# Patient Record
Sex: Female | Born: 1980 | Race: White | Hispanic: No | Marital: Married | Smoking: Never smoker
Health system: Southern US, Community
[De-identification: ages and names within clinical notes are randomized; demographics above are authoritative.]

## PROBLEM LIST (undated history)

## (undated) DIAGNOSIS — F329 Major depressive disorder, single episode, unspecified: Secondary | ICD-10-CM

## (undated) DIAGNOSIS — F32A Depression, unspecified: Secondary | ICD-10-CM

## (undated) HISTORY — DX: Major depressive disorder, single episode, unspecified: F32.9

## (undated) HISTORY — DX: Depression, unspecified: F32.A

---

## 2002-12-26 HISTORY — PX: CHOLECYSTECTOMY: SHX55

## 2002-12-26 HISTORY — PX: GASTRIC BYPASS: SHX52

## 2010-12-26 HISTORY — PX: DILATION AND CURETTAGE OF UTERUS: SHX78

## 2011-05-13 ENCOUNTER — Ambulatory Visit (INDEPENDENT_AMBULATORY_CARE_PROVIDER_SITE_OTHER): Payer: Managed Care, Other (non HMO) | Admitting: Family Medicine

## 2011-05-13 ENCOUNTER — Encounter: Payer: Self-pay | Admitting: Family Medicine

## 2011-05-13 VITALS — BP 118/77 | HR 66 | Temp 97.9°F | Ht 67.0 in | Wt 210.0 lb

## 2011-05-13 DIAGNOSIS — M7750 Other enthesopathy of unspecified foot: Secondary | ICD-10-CM

## 2011-05-13 DIAGNOSIS — M7740 Metatarsalgia, unspecified foot: Secondary | ICD-10-CM

## 2011-05-13 NOTE — Patient Instructions (Signed)
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 Metatarsal pads as well as a cushioned insole are the main two parts of treatment for these - wear as much as possible. For shoes that are too small for the insoles, put metatarsal pads in these in the same place I showed you. Ice foot 15 minutes at a time 3-4 times a day (especially at the end of the day). Tylenol and/or aleve as needed for pain. These inserts tend to last about 4 months, custom orthotics last about 2-3 years. You can order the inserts from [facility] or we can make you orthotics here if you need these long term.

## 2011-05-16 ENCOUNTER — Encounter: Payer: Self-pay | Admitting: Family Medicine

## 2011-05-16 DIAGNOSIS — M7740 Metatarsalgia, unspecified foot: Secondary | ICD-10-CM | POA: Insufficient documentation

## 2011-05-16 NOTE — Assessment & Plan Note (Signed)
pain and exam consistent with metatarsalgia.  No palpable mortons neuroma and no tenderness dorsum of foot between metatarsals.  She brought comforthotic but has not been using - added metatarsal pads (medium) to these which felt comfortable.  Has breakdown bilaterally but only hurting on right side.  Doubt she is developing a 5th MT stress fracture - she is not running and pain is recent - likely related to gait change from metatarsalgia.  Did advise her however that if pain worsens instead of improves with rest, cushion over next 2-3 weeks, would proceed with x-rays, ultrasound to further evaluate for this.

## 2011-05-16 NOTE — Progress Notes (Signed)
  Subjective:    Patient ID: Morgan Burgess, female    DOB: 1981-07-05, 30 y.o.   MRN: [id number]  HPI  30 yo F here for right plantar foot pain.  Patient reports no known injury. Usually goes for a 4 mile walk each night. On Wednesday walked 8 miles instead and 6 miles on Thursday. Began having plantar right foot pain (and lateral pain) Thursday along with limping. Has continued through today. Tried moleskin over area of pain, ibuprofen. No prior problems with feet. No swelling or bruising.  History reviewed. No pertinent past medical history.  No current outpatient prescriptions on file prior to visit.    History reviewed. No pertinent past surgical history.  Allergies  Allergen Reactions  . Latex     History   Social History  . Marital Status: Married    Spouse Name: N/A    Number of Children: N/A  . Years of Education: N/A   Occupational History  . Not on file.   Social History Main Topics  . Smoking status: Never Smoker   . Smokeless tobacco: Not on file  . Alcohol Use: Not on file  . Drug Use: Not on file  . Sexually Active: Not on file   Other Topics Concern  . Not on file   Social History Narrative  . No narrative on file    Family History  Problem Relation Age of Onset  . Hypertension Mother   . Diabetes Neg Hx   . Heart attack Neg Hx     BP 118/77  Pulse 66  Temp(Src) 97.9 F (36.6 C) (Oral)  Ht 5\' 7"  (1.702 m)  Wt 210 lb (95.255 kg)  BMI 32.89 kg/m2  Review of Systems See HPI above.    Objective:   Physical Exam Gen: NAD R foot: Wide forefoot.  Collapsed transverse arch.  MT heads 2nd-4th prominent with callus > left foot but present on left foot as well. Mild collapse long arch. TTP 2nd MT head focally.  Mild TTP through 5th MT.  No other TTP about foot. FROM toes and ankle. 5/5 strength all motions ankle. NVI distally with < 2 sec cap refill, 2+ dp pulses.     Assessment & Plan:  1. Metatarsalgia - pain and exam consistent  with metatarsalgia.  No palpable mortons neuroma and no tenderness dorsum of foot between metatarsals.  She brought comforthotic but has not been using - added metatarsal pads (medium) to these which felt comfortable.  Has breakdown bilaterally but only hurting on right side.  Doubt she is developing a 5th MT stress fracture - she is not running and pain is recent - likely related to gait change from metatarsalgia.  Did advise her however that if pain worsens instead of improves with rest, cushion over next 2-3 weeks, would proceed with x-rays, ultrasound to further evaluate for this.

## 2013-11-01 ENCOUNTER — Ambulatory Visit (INDEPENDENT_AMBULATORY_CARE_PROVIDER_SITE_OTHER): Payer: Managed Care, Other (non HMO) | Admitting: Family Medicine

## 2013-11-01 ENCOUNTER — Encounter: Payer: Self-pay | Admitting: Family Medicine

## 2013-11-01 VITALS — BP 116/78 | HR 93 | Temp 98.1°F | Resp 16 | Ht 66.5 in | Wt 252.4 lb

## 2013-11-01 DIAGNOSIS — F411 Generalized anxiety disorder: Secondary | ICD-10-CM | POA: Insufficient documentation

## 2013-11-01 DIAGNOSIS — Z9884 Bariatric surgery status: Secondary | ICD-10-CM | POA: Insufficient documentation

## 2013-11-01 MED ORDER — ZOLPIDEM TARTRATE 10 MG PO TABS: 10.0000 mg | ORAL_TABLET | Freq: Once | ORAL | Status: DC

## 2013-11-01 MED ORDER — BUSPIRONE HCL 15 MG PO TABS: 15.0000 mg | ORAL_TABLET | Freq: Two times a day (BID) | ORAL | Status: DC

## 2013-11-01 MED ORDER — DICYCLOMINE HCL 20 MG PO TABS: 20.0000 mg | ORAL_TABLET | Freq: Three times a day (TID) | ORAL | Status: DC

## 2013-11-01 NOTE — Progress Notes (Signed)
  Subjective:    Patient ID: Morgan Burgess, female    DOB: 25-Oct-1981, 32 y.o.   MRN: [id number]  HPI New to establish.  Previous MD- [facility]  Depression- chronic problem.  Was previously on meds (Cymbalta) but stopped 'cold [place]' b/c she was trying to get pregnant.  Previous MD thought pt should try adderall.  Was also on Ativan and Ambien.  'i just worry myself to death'.  Just resumed counseling- therapist recommended being seen q2 weeks.  Is considering pregnancy.  Hx of gastric bypass- due for labs.   Review of Systems For ROS see HPI     Objective:   Physical Exam  Vitals reviewed. Constitutional: She is oriented to person, place, and time. She appears well-developed and well-nourished. No distress.  HENT:  Head: Normocephalic and atraumatic.  Eyes: Conjunctivae and EOM are normal. Pupils are equal, round, and reactive to light.  Neck: Normal range of motion. Neck supple. No thyromegaly present.  Cardiovascular: Normal rate, regular rhythm, normal heart sounds and intact distal pulses.   No murmur heard. Pulmonary/Chest: Effort normal and breath sounds normal. No respiratory distress.  Abdominal: Soft. She exhibits no distension. There is no tenderness.  Musculoskeletal: She exhibits no edema.  Lymphadenopathy:    She has no cervical adenopathy.  Neurological: She is alert and oriented to person, place, and time.  Skin: Skin is warm and dry.  Psychiatric: She has a normal mood and affect. Her behavior is normal.          Assessment & Plan:

## 2013-11-01 NOTE — Patient Instructions (Signed)
Follow up in 6-8 weeks to recheck mood Start the Buspar- 1/2 tab twice daily for 1 week and then increase to full tab twice daily Continue counseling- this is VERY important Try and find a stress outlet We'll notify you of your lab results and make any changes if needed Call with any questions or concerns Hang in there!!

## 2013-11-04 ENCOUNTER — Encounter: Payer: Self-pay | Admitting: Family Medicine

## 2013-11-04 LAB — VITAMIN D 1,25 DIHYDROXY
Vitamin D2 1, 25 (OH)2: 21 pg/mL
Vitamin D3 1, 25 (OH)2: 46 pg/mL

## 2013-11-05 ENCOUNTER — Encounter: Payer: Self-pay | Admitting: Family Medicine

## 2013-11-06 ENCOUNTER — Encounter: Payer: Self-pay | Admitting: General Practice

## 2013-11-13 ENCOUNTER — Encounter: Payer: Self-pay | Admitting: Family Medicine

## 2013-11-24 NOTE — Assessment & Plan Note (Signed)
New to provider, has list of required labs.  Will draw today.

## 2013-11-24 NOTE — Assessment & Plan Note (Signed)
New.  Moderate to severe.  Pt is in counseling.  Considering pregnancy so will attempt to control sxs w/ Buspar which is Category B in pregnancy.  Will follow closely.  Total time spent w/ pt, 30 min, >50% spent counseling

## 2013-11-26 ENCOUNTER — Encounter: Payer: Self-pay | Admitting: General Practice

## 2013-11-29 ENCOUNTER — Ambulatory Visit (INDEPENDENT_AMBULATORY_CARE_PROVIDER_SITE_OTHER): Payer: BC Managed Care – PPO | Admitting: Family Medicine

## 2013-11-29 ENCOUNTER — Encounter: Payer: Self-pay | Admitting: Family Medicine

## 2013-11-29 ENCOUNTER — Ambulatory Visit: Payer: Managed Care, Other (non HMO) | Admitting: Family Medicine

## 2013-11-29 VITALS — BP 120/80 | HR 80 | Temp 98.1°F | Resp 16 | Wt 254.1 lb

## 2013-11-29 DIAGNOSIS — Z3490 Encounter for supervision of normal pregnancy, unspecified, unspecified trimester: Secondary | ICD-10-CM | POA: Insufficient documentation

## 2013-11-29 DIAGNOSIS — F411 Generalized anxiety disorder: Secondary | ICD-10-CM

## 2013-11-29 DIAGNOSIS — Z331 Pregnant state, incidental: Secondary | ICD-10-CM

## 2013-11-29 MED ORDER — OB COMPLETE PETITE 35-5-1-200 MG PO CAPS: 1.0000 | ORAL_CAPSULE | Freq: Every day | ORAL | Status: DC

## 2013-11-29 NOTE — Progress Notes (Signed)
   Subjective:    Patient ID: Morgan Burgess, female    DOB: Dec 23, 1981, 32 y.o.   MRN: [id number]  HPI Pre visit review using our clinic review tool, if applicable. No additional management support is needed unless otherwise documented below in the visit note.  Anxiety- feels that meds have helped.  Taking med once daily rather than twice.  Feels that anxiety has improved.  Counseling is helping- going q2 weeks.  Pt is currently pregnant.  Has OB visit upcoming- [facility].  Needs script for PNV- OB complete petite.    Review of Systems For ROS see HPI     Objective:   Physical Exam  Vitals reviewed. Constitutional: She is oriented to person, place, and time. She appears well-developed and well-nourished. No distress.  Neurological: She is alert and oriented to person, place, and time.  Skin: Skin is warm and dry.  Psychiatric: She has a normal mood and affect. Her behavior is normal. Thought content normal.          Assessment & Plan:

## 2013-11-29 NOTE — Patient Instructions (Signed)
Follow up as needed Set your pill reminder to take both the morning Buspar and the Vitamin CONGRATS!!! Happy Holidays!!!

## 2013-12-01 NOTE — Assessment & Plan Note (Signed)
Improved on Buspar.  Encouraged pt to take meds twice daily for even better sx control.  Pt has stopped benzos and Cymbalta due to pregnancy.  Continues counseling.  Will follow.

## 2013-12-01 NOTE — Assessment & Plan Note (Signed)
New.  Pt had upcoming OB appt but needs special prescription PNV due to gastric bypass and size of pills.  Script sent.

## 2013-12-04 MED ORDER — FOLIC ACID 800 MCG PO TABS: 800.0000 ug | ORAL_TABLET | Freq: Every day | ORAL | Status: DC

## 2014-03-19 ENCOUNTER — Encounter: Payer: Self-pay | Admitting: Family Medicine

## 2014-03-20 ENCOUNTER — Other Ambulatory Visit: Payer: Self-pay | Admitting: Family Medicine

## 2014-03-20 MED ORDER — BUSPIRONE HCL 15 MG PO TABS: 15.0000 mg | ORAL_TABLET | Freq: Two times a day (BID) | ORAL | Status: DC

## 2014-04-17 ENCOUNTER — Other Ambulatory Visit: Payer: Self-pay | Admitting: Family Medicine

## 2014-04-17 MED ORDER — DICYCLOMINE HCL 20 MG PO TABS: 20.0000 mg | ORAL_TABLET | Freq: Three times a day (TID) | ORAL | Status: DC

## 2014-04-17 NOTE — Telephone Encounter (Signed)
Please advise if refill is appropriate.     KP 

## 2014-06-02 ENCOUNTER — Telehealth: Payer: Self-pay | Admitting: Family Medicine

## 2014-06-02 MED ORDER — LEVOCETIRIZINE DIHYDROCHLORIDE 5 MG PO TABS: 5.0000 mg | ORAL_TABLET | Freq: Every evening | ORAL | Status: DC

## 2014-06-02 NOTE — Telephone Encounter (Signed)
[phone] °

## 2014-06-02 NOTE — Telephone Encounter (Signed)
Caller name:Jacalynn Shella Spearing Relation to pt: patient Call back number:[phone] Pharmacy:[facility] [place] - [address], patient would like a call once this is completed. Please advise.    Reason for call: patient called to request a refill for levocetirizine (XYZAL) 5 MG tablet

## 2014-06-02 NOTE — Telephone Encounter (Signed)
Med filled.  

## 2014-06-02 NOTE — Telephone Encounter (Signed)
Patient states she was told to follow up in 1 year from 11/2013 since she is pregnant. She needs more than 1 refill on her allergy medicine.

## 2014-07-01 ENCOUNTER — Telehealth: Payer: Self-pay | Admitting: General Practice

## 2014-07-01 NOTE — Telephone Encounter (Signed)
Ok for #60, 1 refill.  Needs CSC and UDS if not already on file

## 2014-07-01 NOTE — Telephone Encounter (Signed)
Last OV 11-29-13 This is listed as a reported med, no record of us ever filling. SIG states lorazepam 0.5mg , take 1 tablet by mouth twice daily as needed, Dispense #60.   Please advise, no CSC or UDS on file

## 2014-07-02 MED ORDER — LORAZEPAM 0.5 MG PO TABS: 0.5000 mg | ORAL_TABLET | Freq: Three times a day (TID) | ORAL | Status: DC | PRN

## 2014-07-02 NOTE — Telephone Encounter (Signed)
Med filled, CSC printed, pt notified to pick up rx at the office.

## 2014-07-18 NOTE — Telephone Encounter (Signed)
Pt provided UDS to lab. Pt wanted us to be aware that shes taking percocet that she received from the dentist office.

## 2014-08-01 ENCOUNTER — Encounter: Payer: Self-pay | Admitting: Family Medicine

## 2014-08-08 ENCOUNTER — Telehealth: Payer: Self-pay

## 2014-08-08 NOTE — Telephone Encounter (Signed)
Bentyl is Category B in pregnancy (completely safe) but not recommended while breastfeeding.  She has not done any harm to the baby but should stop the medication

## 2014-08-08 NOTE — Telephone Encounter (Signed)
Caller name:[name] Relation to pt: Call back number:[phone] Pharmacy:  Reason for call: Morgan Burgess called she has just come home from hospital and the pharmacy told her she should not be taking Bentyl while breast feeding. She has taken it the whole time she was pregnant and while in the hospital.Please call an advise her of what to do

## 2014-08-08 NOTE — Telephone Encounter (Signed)
Called and left a detailed message regarding bentyl and lorazepam.  Called husband to see if we could get in contact with patient before phones are shut off.  Husband answered and handed phone to Dr. Beverely Lowabori.

## 2014-08-10 NOTE — Telephone Encounter (Signed)
Spoke w/ pt on Friday 8/14 at 5pm and instructed pt NOT to take Bentyl while breastfeeding.  Pt reports her abdominal issues stem from her lactose intolerance- pt able to take Lactaid w/o impact on breast feeding.  Discussed w/ pt her request request for Lorazepam.  Pt reports this was a pharmacy error and she remembered that I told her not to take medication.  She has not taken Lorazepam during pregnancy nor since delivery.  Applauded this decision.

## 2014-10-01 ENCOUNTER — Other Ambulatory Visit: Payer: Self-pay | Admitting: Family Medicine

## 2014-10-01 MED ORDER — ZOLPIDEM TARTRATE 10 MG PO TABS: 10.0000 mg | ORAL_TABLET | Freq: Every day | ORAL | Status: DC

## 2014-10-01 NOTE — Telephone Encounter (Signed)
Last OV 11-29-13 ambien last filled 11-01-13 #30 with 3  No upcoming appts.

## 2014-10-01 NOTE — Telephone Encounter (Signed)
Med filled and faxed.  

## 2014-10-01 NOTE — Telephone Encounter (Signed)
Please remind pt to schedule her CPE as we are coming up on 1 yr

## 2014-10-01 NOTE — Telephone Encounter (Signed)
Last OV 11-29-13, no upcoming appts.  xyzal last filled 06-02-14 #30 with 1

## 2014-10-07 ENCOUNTER — Telehealth: Payer: Self-pay | Admitting: Family Medicine

## 2014-10-07 NOTE — Telephone Encounter (Signed)
Pt states that she went to her OBGYN for her post-partum check today and she did not pass the evaluation, and her doctor suggested that she go back on her rx cymbalta verses the busPIRone (BUSPAR) 15 MG tablet, pt needs [facility] rx [facility] [address] Pt also needs new rx for  levocetirizine (xyzal) 5 mg.

## 2014-10-08 NOTE — Telephone Encounter (Signed)
Is pt breast feeding??

## 2014-10-08 NOTE — Telephone Encounter (Signed)
Cymbalta is not recommended while breastfeeding.  Typically women who are breast feeding are started on Zoloft for post-partum depression.  If GYN wants pt on Cymbalta, would need to prescribe it for pt b/c I am not comfortable with this as long as she is breastfeeding.

## 2014-10-08 NOTE — Telephone Encounter (Signed)
Pt.notified

## 2014-10-08 NOTE — Telephone Encounter (Signed)
Pt returned call.  The answer is YES.  Call back if need more info.   [phone]

## 2014-10-08 NOTE — Telephone Encounter (Signed)
Called pt to find out had to lmovm to return call.

## 2014-11-28 ENCOUNTER — Ambulatory Visit (INDEPENDENT_AMBULATORY_CARE_PROVIDER_SITE_OTHER): Payer: BC Managed Care – PPO | Admitting: Family Medicine

## 2014-11-28 ENCOUNTER — Encounter: Payer: Self-pay | Admitting: Family Medicine

## 2014-11-28 VITALS — BP 114/80 | HR 79 | Resp 16 | Wt 239.1 lb

## 2014-11-28 DIAGNOSIS — L659 Nonscarring hair loss, unspecified: Secondary | ICD-10-CM

## 2014-11-28 DIAGNOSIS — Z9884 Bariatric surgery status: Secondary | ICD-10-CM

## 2014-11-28 NOTE — Progress Notes (Signed)
   Subjective:    Patient ID: Morgan Burgess, female    DOB: 09/21/1981, 33 y.o.   MRN: [id number]  HPI Hair thinning- 'i need blood work'.  Pt is s/p gastric bypass.  Pt thought it was due to pregnancy but bariatric surgeon recommended pt get labs done.  Pt was previously on Vit D and B12 shots but stopped due to pregnancy and breast feeding.  Denies fatigue.  Thin, brittle nails.  Denies dry skin.   Review of Systems For ROS see HPI     Objective:   Physical Exam  Constitutional: She is oriented to person, place, and time. She appears well-developed and well-nourished. No distress.  HENT:  Head: Normocephalic and atraumatic.  Neck: Normal range of motion. Neck supple. No thyromegaly present.  Cardiovascular: Normal rate, regular rhythm and normal heart sounds.   Pulmonary/Chest: Effort normal and breath sounds normal. No respiratory distress. She has no wheezes. She has no rales.  Lymphadenopathy:    She has no cervical adenopathy.  Neurological: She is alert and oriented to person, place, and time.  Skin: Skin is warm and dry.  Psychiatric: She has a normal mood and affect. Her behavior is normal. Thought content normal.  Vitals reviewed.         Assessment & Plan:

## 2014-11-28 NOTE — Patient Instructions (Signed)
Schedule your complete physical in 6 months We'll notify you of your lab results and make any changes if needed Start daily multi-vitamin and we'll add on from there Call with any questions or concerns Happy Holidays!!!

## 2014-11-28 NOTE — Progress Notes (Signed)
Pre visit review using our clinic review tool, if applicable. No additional management support is needed unless otherwise documented below in the visit note. 

## 2014-11-30 NOTE — Assessment & Plan Note (Signed)
New.  Reviewed that this may be normal post-partum hair loss.  Due to pt's hx of gastric bypass and the fact that she has stopped all vitamins/supplements will get labs to r/o abnormality/deficiency.  Pt expressed understanding and is in agreement w/ plan.

## 2014-11-30 NOTE — Assessment & Plan Note (Signed)
Chronic problem.  Pt stopped all vitamins and supplements during pregnancy.  Due for labs to determine if she has any electrolyte or vitamin abnormalities.  Replete prn.

## 2014-12-04 ENCOUNTER — Telehealth: Payer: Self-pay | Admitting: Family Medicine

## 2014-12-04 NOTE — Telephone Encounter (Signed)
Pt states she will call [facility] and request they add on iron panel.

## 2014-12-04 NOTE — Telephone Encounter (Signed)
Caller name: Efraim Kaufmannmelissa Relation to pt: self Call back number: [phone]. Please ask for patient. This is her work.  Pharmacy:  Reason for call:   Patient states that we were supposed to check her iron panel at lab visit. She would like this added on to her last lab draw.

## 2014-12-04 NOTE — Telephone Encounter (Signed)
Ok to add iron panel- pt walked out of here w/ prescription for lab orders so not sure how we will ok an add on.

## 2014-12-04 NOTE — Telephone Encounter (Signed)
I am not sure what she is talking about this pt has not had labs since 10/2013 and does not have an upcoming appt? Did you receive paperwork on her?

## 2014-12-09 ENCOUNTER — Telehealth: Payer: Self-pay | Admitting: Family Medicine

## 2014-12-09 NOTE — Telephone Encounter (Signed)
Pt inquiring about lab results

## 2014-12-09 NOTE — Telephone Encounter (Signed)
Have you seen labs?  

## 2014-12-10 NOTE — Telephone Encounter (Signed)
Pt had labs drawn at outside lab, I have not seen them yet

## 2014-12-29 NOTE — Telephone Encounter (Signed)
Patient states that she dropped off lab results last week with [facility] and is requesting a callback. Best # [phone]

## 2014-12-29 NOTE — Telephone Encounter (Signed)
Unless this was sent for scanning, I don't have a copy and don't believe I have seen it.  If we can have the lab send [name] another copy, that would be helpful

## 2014-12-29 NOTE — Telephone Encounter (Signed)
Please advise 

## 2014-12-30 NOTE — Telephone Encounter (Signed)
Pt will fax over lab results tomorrow 12/31/14

## 2014-12-31 NOTE — Telephone Encounter (Signed)
Last note stated that pt would fax them to the office today 12/31/14

## 2014-12-31 NOTE — Telephone Encounter (Signed)
Have you seen these labs? I would have put the MRN and Dr name on them and put them in your bin.

## 2014-12-31 NOTE — Telephone Encounter (Signed)
Where do I need to call to obtain these lab results?

## 2015-01-27 NOTE — Telephone Encounter (Signed)
Patient states that she came by herself and dropped off the results last week. Best # [phone]

## 2015-01-28 NOTE — Telephone Encounter (Signed)
Did you get these?

## 2015-01-28 NOTE — Telephone Encounter (Signed)
Have not seen labs

## 2015-01-28 NOTE — Telephone Encounter (Signed)
I do not have these results.  Ashlee, have you seen them?

## 2015-01-28 NOTE — Telephone Encounter (Signed)
Do you know where these labs are?

## 2015-01-29 ENCOUNTER — Telehealth: Payer: Self-pay | Admitting: Family Medicine

## 2015-01-29 NOTE — Telephone Encounter (Addendum)
Caller name: Louis MatteBoyer, Shaynah Relation to pt: self  Call back number: [phone] Pharmacy:  Reason for call:  Pt checking on the status of labs, pt states she needs her medication.

## 2015-01-29 NOTE — Telephone Encounter (Signed)
These labs cannot be found, [facility] for further inquiry.

## 2015-01-30 MED ORDER — FERROUS SULFATE 325 (65 FE) MG PO TABS: 325.0000 mg | ORAL_TABLET | Freq: Every day | ORAL | Status: DC

## 2015-01-30 MED ORDER — VITAMIN D (ERGOCALCIFEROL) 1.25 MG (50000 UNIT) PO CAPS
50000.0000 [IU] | ORAL_CAPSULE | ORAL | Status: DC
Start: 2015-01-30 — End: 2015-06-01

## 2015-01-30 NOTE — Telephone Encounter (Signed)
What meds does pt require?  I have not seen these labs in over 2 months and pt has reportedly brought or sent multiple copies.  This needs to be resolved so we can move forward.

## 2015-01-30 NOTE — Telephone Encounter (Signed)
Pt notified and meds filled. Labs sent to scanning.

## 2015-01-30 NOTE — Telephone Encounter (Signed)
The labs have not be located as of yet. It has more than likely been sent to scanning without the provider review. It has not been scanned into the chart as of yet.

## 2015-01-30 NOTE — Telephone Encounter (Signed)
Labs look good w/ exception of mild anemia, low iron, and low Vit D.  Based on this, we need to start 50,000 units weekly x12 weeks in addition to daily OTC supplement of 2000 units.  Also, needs to start FeSO4 325 daily (along w/ stool softener)

## 2015-01-30 NOTE — Telephone Encounter (Signed)
Pt brought these labs to the office again today 01/30/15. Morgan Burgess personally brought these to tabori to document on.

## 2015-01-30 NOTE — Telephone Encounter (Signed)
This pt is very unhappy at this point (understandably).  Please help us remedy this situation.

## 2015-01-30 NOTE — Telephone Encounter (Signed)
Error

## 2015-01-30 NOTE — Addendum Note (Signed)
Addended by: Jackson LatinoYLER, JESSICA L on: 01/30/2015 02:32 PM   Modules accepted: Orders

## 2015-03-23 LAB — HM PAP SMEAR: HM PAP: NORMAL

## 2015-03-31 ENCOUNTER — Other Ambulatory Visit: Payer: Self-pay | Admitting: Family Medicine

## 2015-03-31 NOTE — Telephone Encounter (Signed)
Last OV 11-28-14 ambien last filled 10-01-14 #30 with 3

## 2015-03-31 NOTE — Telephone Encounter (Signed)
Med filled and faxed.  

## 2015-04-10 ENCOUNTER — Other Ambulatory Visit: Payer: Self-pay | Admitting: Family Medicine

## 2015-04-10 ENCOUNTER — Encounter: Payer: Self-pay | Admitting: Family Medicine

## 2015-04-10 MED ORDER — LEVOCETIRIZINE DIHYDROCHLORIDE 5 MG PO TABS: 5.0000 mg | ORAL_TABLET | Freq: Every evening | ORAL | Status: DC

## 2015-04-10 MED ORDER — LORAZEPAM 0.5 MG PO TABS: 0.5000 mg | ORAL_TABLET | Freq: Three times a day (TID) | ORAL | Status: DC | PRN

## 2015-04-10 NOTE — Telephone Encounter (Signed)
Med denied, this was just filled on 03-31-15.

## 2015-04-10 NOTE — Telephone Encounter (Signed)
Med filled.  

## 2015-05-11 ENCOUNTER — Other Ambulatory Visit: Payer: Self-pay | Admitting: Physician Assistant

## 2015-05-11 ENCOUNTER — Encounter: Payer: Self-pay | Admitting: Physician Assistant

## 2015-05-11 ENCOUNTER — Other Ambulatory Visit: Payer: Self-pay | Admitting: Family Medicine

## 2015-05-11 ENCOUNTER — Ambulatory Visit (INDEPENDENT_AMBULATORY_CARE_PROVIDER_SITE_OTHER): Payer: BLUE CROSS/BLUE SHIELD | Admitting: Physician Assistant

## 2015-05-11 VITALS — BP 132/78 | HR 80 | Temp 98.2°F | Ht 67.0 in | Wt 229.2 lb

## 2015-05-11 DIAGNOSIS — F411 Generalized anxiety disorder: Secondary | ICD-10-CM

## 2015-05-11 MED ORDER — BUSPIRONE HCL 15 MG PO TABS: ORAL_TABLET | ORAL | Status: DC

## 2015-05-11 MED ORDER — KETOCONAZOLE-HYDROCORTISONE 2 & 1 % (CREAM) EX KIT: PACK | CUTANEOUS | Status: DC

## 2015-05-11 NOTE — Progress Notes (Signed)
   Patient presents to clinic today to discuss restarting medication for generalized anxiety.  Endorses increase stressors at home and work including 80-hour work weeks and possible separation from her husband. Is also taking care of her children and finding it hard to take time for herself.  Endorses increased anxiety but denies panic attack or depressed mood.  Feels that she is relying on her Ativan "way too much".  Was previously onBuspar with good relief but stopped medication when she became pregnant.  Past Medical History  Diagnosis Date  . Depression     Current Outpatient Prescriptions on File Prior to Visit  Medication Sig Dispense Refill  . ferrous sulfate 325 (65 FE) MG tablet Take 1 tablet (325 mg total) by mouth daily with breakfast. 30 tablet 3  . levocetirizine (XYZAL) 5 MG tablet Take 1 tablet (5 mg total) by mouth every evening. 30 tablet 11  . LORazepam (ATIVAN) 0.5 MG tablet Take 1 tablet (0.5 mg total) by mouth every 8 (eight) hours as needed. 60 tablet 1  . Vitamin D, Ergocalciferol, (DRISDOL) 50000 UNITS CAPS capsule Take 1 capsule (50,000 Units total) by mouth every 7 (seven) days. 12 capsule 0  . zolpidem (AMBIEN) 10 MG tablet TAKE 1 TABLET BY MOUTH EVERY NIGHT AT BEDTIME 30 tablet 0   No current facility-administered medications on file prior to visit.    Allergies  Allergen Reactions  . Latex     Family History  Problem Relation Age of Onset  . Hypertension Mother   . Diabetes Neg Hx   . Heart attack Neg Hx   . Hypertension Father     History   Social History  . Marital Status: Married    Spouse Name: N/A  . Number of Children: N/A  . Years of Education: N/A   Social History Main Topics  . Smoking status: Never Smoker   . Smokeless tobacco: Not on file  . Alcohol Use: No  . Drug Use: No  . Sexual Activity: Yes   Other Topics Concern  . None   Social History Narrative   Review of Systems - See HPI.  All other ROS are negative.  BP 132/78  mmHg  Pulse 80  Temp(Src) 98.2 F (36.8 C) (Oral)  Ht 5\' 7"  (1.702 m)  Wt 229 lb 4 oz (103.987 kg)  BMI 35.90 kg/m2  SpO2 98%  Physical Exam  Constitutional: She is oriented to person, place, and time and well-developed, well-nourished, and in no distress.  HENT:  Head: Normocephalic and atraumatic.  Cardiovascular: Normal rate, regular rhythm, normal heart sounds and intact distal pulses.   Pulmonary/Chest: Effort normal and breath sounds normal. No respiratory distress. She has no wheezes. She has no rales. She exhibits no tenderness.  Neurological: She is alert and oriented to person, place, and time.  Skin: Skin is warm and dry. No rash noted.  Vitals reviewed.    Assessment/Plan: Generalized anxiety disorder Will resume BuSpar -- 7.5 mg BID x 1 week then increase to 15 mg BID.  Continue Ativan but limit use.  Supportive measures discussed. Continue counseling sessions.  Follow-up with PCP in 1 month.

## 2015-05-11 NOTE — Telephone Encounter (Signed)
Med filled and faxed.  

## 2015-05-11 NOTE — Assessment & Plan Note (Signed)
Will resume BuSpar -- 7.5 mg BID x 1 week then increase to 15 mg BID.  Continue Ativan but limit use.  Supportive measures discussed. Continue counseling sessions.  Follow-up with PCP in 1 month.

## 2015-05-11 NOTE — Patient Instructions (Signed)
Please restart the BuSpar, taking 1/2 tablet twice daily for 1 week before increasing to 1 tablet twice daily.  Continue other mediations as directed.  Follow-up in 4 weeks with Dr. Beverely Lowabori.

## 2015-05-11 NOTE — Telephone Encounter (Signed)
Med denied.  

## 2015-05-11 NOTE — Telephone Encounter (Signed)
Last OV 11-28-14 Zolpidem last filled 03-31-15 #30 with 0

## 2015-05-12 ENCOUNTER — Other Ambulatory Visit: Payer: Self-pay | Admitting: Physician Assistant

## 2015-05-12 ENCOUNTER — Telehealth: Payer: Self-pay | Admitting: Family Medicine

## 2015-05-12 MED ORDER — KETOCONAZOLE 2 % EX SHAM: 1.0000 "application " | MEDICATED_SHAMPOO | CUTANEOUS | Status: DC

## 2015-05-12 NOTE — Telephone Encounter (Signed)
Ok to use?

## 2015-05-12 NOTE — Telephone Encounter (Signed)
Relation to pt: self  Call back number: [phone]   Reason for call:  Pt would like a 2:45pm on June 6th (same day appointment) due to work conflict. Please advise

## 2015-06-01 ENCOUNTER — Ambulatory Visit (INDEPENDENT_AMBULATORY_CARE_PROVIDER_SITE_OTHER): Payer: BLUE CROSS/BLUE SHIELD | Admitting: Family Medicine

## 2015-06-01 ENCOUNTER — Ambulatory Visit: Payer: Self-pay | Admitting: Family Medicine

## 2015-06-01 ENCOUNTER — Encounter: Payer: Self-pay | Admitting: Family Medicine

## 2015-06-01 VITALS — BP 128/80 | HR 83 | Temp 97.9°F | Resp 16 | Wt 228.0 lb

## 2015-06-01 DIAGNOSIS — F411 Generalized anxiety disorder: Secondary | ICD-10-CM | POA: Diagnosis not present

## 2015-06-01 MED ORDER — LORAZEPAM 0.5 MG PO TABS: 0.5000 mg | ORAL_TABLET | Freq: Three times a day (TID) | ORAL | Status: DC | PRN

## 2015-06-01 MED ORDER — ZOLPIDEM TARTRATE 10 MG PO TABS: 10.0000 mg | ORAL_TABLET | Freq: Every day | ORAL | Status: DC

## 2015-06-01 MED ORDER — CITALOPRAM HYDROBROMIDE 20 MG PO TABS: 20.0000 mg | ORAL_TABLET | Freq: Every day | ORAL | Status: DC

## 2015-06-01 NOTE — Patient Instructions (Signed)
Follow up in 3-4 weeks to recheck mood Continue the Buspar for the next 1-2 weeks and then stop START the celexa once daily Keep up the good work on counseling and exercise- both are an excellent outlet! Call with any questions or concerns Hang in there!  You can do this!

## 2015-06-01 NOTE — Assessment & Plan Note (Signed)
Deteriorated.  Pt reports Buspar was worsening her depression.  Based on this, will start SSRI and monitor for improvement in depression and anxiety.  Discussed possible side effects.  Applauded pt's efforts at counseling and regular exercise as a stress outlet.  Will follow closely.

## 2015-06-01 NOTE — Progress Notes (Signed)
   Subjective:    Patient ID: Morgan Burgess, female    DOB: 06/21/1981, 34 y.o.   MRN: [id number]  HPI Anxiety- chronic problem, recently deteriorated which prompted pt to have visit w/ Selena Battenody.  Pt is taking Buspar once daily rather than twice due to worsening depression and some anxiety w/ evening Buspar.  No longer having panic attacks but still having intermittent sadness and tearfulness.     Review of Systems For ROS see HPI     Objective:   Physical Exam  Constitutional: She is oriented to person, place, and time. She appears well-developed and well-nourished. No distress.  HENT:  Head: Normocephalic and atraumatic.  Neurological: She is alert and oriented to person, place, and time.  Skin: Skin is warm and dry.  Psychiatric: She has a normal mood and affect. Her behavior is normal. Thought content normal.  Vitals reviewed.         Assessment & Plan:

## 2015-06-01 NOTE — Progress Notes (Signed)
Pre visit review using our clinic review tool, if applicable. No additional management support is needed unless otherwise documented below in the visit note. 

## 2015-06-15 ENCOUNTER — Ambulatory Visit: Payer: BLUE CROSS/BLUE SHIELD | Admitting: Physician Assistant

## 2015-07-06 ENCOUNTER — Encounter: Payer: Self-pay | Admitting: Family Medicine

## 2015-07-06 ENCOUNTER — Ambulatory Visit (INDEPENDENT_AMBULATORY_CARE_PROVIDER_SITE_OTHER): Payer: BLUE CROSS/BLUE SHIELD | Admitting: Family Medicine

## 2015-07-06 VITALS — BP 126/78 | HR 81 | Temp 97.9°F | Resp 16 | Ht 67.0 in | Wt 229.5 lb

## 2015-07-06 DIAGNOSIS — F411 Generalized anxiety disorder: Secondary | ICD-10-CM

## 2015-07-06 NOTE — Progress Notes (Signed)
   Subjective:    Patient ID: Louis MatteMelissa Waldridge, female    DOB: 11/06/1981, 34 y.o.   MRN: [id number]  HPI Anxiety- pt feels sxs are better controlled on the Celexa than the Buspar.  Less wound, less irritable, less tearful.  Pt reports sxs improved w/in 2-3 weeks.  Pt is not interested in increasing dose.   Review of Systems For ROS see HPI     Objective:   Physical Exam  Constitutional: She is oriented to person, place, and time. She appears well-developed and well-nourished. No distress.  HENT:  Head: Normocephalic and atraumatic.  Eyes: Conjunctivae and EOM are normal. Pupils are equal, round, and reactive to light.  Pulmonary/Chest: Effort normal. No respiratory distress.  Neurological: She is alert and oriented to person, place, and time.  Skin: Skin is warm and dry.  Psychiatric: She has a normal mood and affect. Her behavior is normal. Thought content normal.  Vitals reviewed.         Assessment & Plan:

## 2015-07-06 NOTE — Patient Instructions (Signed)
Schedule your complete physical in 6 months Keep up the good work!  You look great! Call with any questions or concerns Have a great summer!!! 

## 2015-07-06 NOTE — Progress Notes (Signed)
Pre visit review using our clinic review tool, if applicable. No additional management support is needed unless otherwise documented below in the visit note. 

## 2015-07-06 NOTE — Assessment & Plan Note (Signed)
Much improved since switching to Celexa from Buspar.  No dose change at this time.  Will continue to monitor.

## 2015-07-17 ENCOUNTER — Encounter: Payer: Self-pay | Admitting: Family Medicine

## 2015-07-20 ENCOUNTER — Encounter: Payer: Self-pay | Admitting: Family Medicine

## 2015-07-20 ENCOUNTER — Ambulatory Visit (INDEPENDENT_AMBULATORY_CARE_PROVIDER_SITE_OTHER): Payer: BLUE CROSS/BLUE SHIELD | Admitting: Family Medicine

## 2015-07-20 VITALS — BP 116/68 | HR 68 | Temp 97.4°F | Ht 67.0 in | Wt 230.2 lb

## 2015-07-20 DIAGNOSIS — K047 Periapical abscess without sinus: Secondary | ICD-10-CM | POA: Insufficient documentation

## 2015-07-20 MED ORDER — AMOXICILLIN-POT CLAVULANATE 500-125 MG PO TABS: 1.0000 | ORAL_TABLET | Freq: Two times a day (BID) | ORAL | Status: DC

## 2015-07-20 MED ORDER — OXYCODONE-ACETAMINOPHEN 7.5-325 MG PO TABS: 1.0000 | ORAL_TABLET | ORAL | Status: DC | PRN

## 2015-07-20 NOTE — Progress Notes (Signed)
Pre visit review using our clinic review tool, if applicable. No additional management support is needed unless otherwise documented below in the visit note. 

## 2015-07-20 NOTE — Patient Instructions (Signed)
Follow up as needed Take the Augmentin twice daily- take w/ food Use the pain meds as needed Gargle w/ salt water for infection prevention Call with any questions or concerns Hang in there! Have a safe trip!!

## 2015-07-20 NOTE — Assessment & Plan Note (Signed)
New to provider, recurrent problem for pt.  Refill provided on Augmentin.  Pain meds prn.  No abscess to drain at this time.  Pt to f/u w/ dentist when she returns from trip to [place].  Reviewed supportive care and red flags that should prompt return.  Pt expressed understanding and is in agreement w/ plan.

## 2015-07-20 NOTE — Progress Notes (Signed)
   Subjective:    Patient ID: Morgan Burgess, female    DOB: [birth year], 34 y.o.   MRN: [id number]  HPI Dental infxn-  Pt has known impacted wisdom tooth and was supposed to have surgery but then got pregnant and they were unable to operate.  Dentist is out of town and they recommended that she see PCP for abx and pain meds to take while she is out of town and then dentist can take care of it.  Has been taking left over abx and pain meds.  Impacted/infected tooth is R bottom.  Had pus pocket and drainage on Friday- 'i popped it'.  Now no drainage.  No fevers.  Pain is better controlled but will throb in the evenings and overnight.   Review of Systems For ROS see HPI     Objective:   Physical Exam  Constitutional: She is oriented to person, place, and time. She appears well-developed and well-nourished. No distress.  obese  HENT:  Head: Normocephalic and atraumatic.  Multiple dental caries.  Infected R lower wisdom tooth w/o obvious abscess formation  Neck: Normal range of motion.  Lymphadenopathy:    She has no cervical adenopathy.  Neurological: She is alert and oriented to person, place, and time.  Skin: Skin is warm and dry.  Psychiatric: She has a normal mood and affect. Her behavior is normal. Thought content normal.  Vitals reviewed.         Assessment & Plan:

## 2015-08-01 ENCOUNTER — Encounter (HOSPITAL_COMMUNITY): Payer: Self-pay | Admitting: Emergency Medicine

## 2015-08-01 ENCOUNTER — Emergency Department (HOSPITAL_COMMUNITY)
Admission: EM | Admit: 2015-08-01 | Discharge: 2015-08-01 | Disposition: A | Discharge status: Home / Self Care | Payer: BLUE CROSS/BLUE SHIELD | Attending: Emergency Medicine | Admitting: Emergency Medicine

## 2015-08-01 ENCOUNTER — Emergency Department (HOSPITAL_COMMUNITY): Payer: BLUE CROSS/BLUE SHIELD

## 2015-08-01 DIAGNOSIS — Z79899 Other long term (current) drug therapy: Secondary | ICD-10-CM | POA: Insufficient documentation

## 2015-08-01 DIAGNOSIS — S1081XA Abrasion of other specified part of neck, initial encounter: Secondary | ICD-10-CM | POA: Insufficient documentation

## 2015-08-01 DIAGNOSIS — W19XXXA Unspecified fall, initial encounter: Secondary | ICD-10-CM

## 2015-08-01 DIAGNOSIS — F329 Major depressive disorder, single episode, unspecified: Secondary | ICD-10-CM | POA: Insufficient documentation

## 2015-08-01 DIAGNOSIS — S4991XA Unspecified injury of right shoulder and upper arm, initial encounter: Secondary | ICD-10-CM | POA: Insufficient documentation

## 2015-08-01 DIAGNOSIS — Z792 Long term (current) use of antibiotics: Secondary | ICD-10-CM | POA: Insufficient documentation

## 2015-08-01 DIAGNOSIS — W01198A Fall on same level from slipping, tripping and stumbling with subsequent striking against other object, initial encounter: Secondary | ICD-10-CM | POA: Insufficient documentation

## 2015-08-01 DIAGNOSIS — Y9289 Other specified places as the place of occurrence of the external cause: Secondary | ICD-10-CM | POA: Insufficient documentation

## 2015-08-01 DIAGNOSIS — S3992XA Unspecified injury of lower back, initial encounter: Secondary | ICD-10-CM | POA: Insufficient documentation

## 2015-08-01 DIAGNOSIS — Y998 Other external cause status: Secondary | ICD-10-CM | POA: Insufficient documentation

## 2015-08-01 DIAGNOSIS — Y9389 Activity, other specified: Secondary | ICD-10-CM | POA: Insufficient documentation

## 2015-08-01 MED ORDER — METHOCARBAMOL 500 MG PO TABS: 500.0000 mg | ORAL_TABLET | Freq: Two times a day (BID) | ORAL | Status: DC

## 2015-08-01 MED ORDER — IBUPROFEN 800 MG PO TABS: 800.0000 mg | ORAL_TABLET | Freq: Three times a day (TID) | ORAL | Status: DC | PRN

## 2015-08-01 MED ORDER — HYDROCODONE-ACETAMINOPHEN 5-325 MG PO TABS
1.0000 | ORAL_TABLET | Freq: Once | ORAL | Status: AC
  Administered 2015-08-01: 1 via ORAL
  Filled 2015-08-01: qty 1

## 2015-08-01 NOTE — ED Notes (Signed)
Pt states she has a ride home. 

## 2015-08-01 NOTE — ED Provider Notes (Signed)
CSN: [id number]     Arrival date & time 08/01/15  1918 History  This chart was scribed for Richardean Canal, MD by Andrew Au, ED Scribe. This patient was seen in room WTR7/WTR7 and the patient's care was started at 9:07 PM.   Chief Complaint  Patient presents with  . Fall  . Back Pain   Patient is a 34 y.o. female presenting with back pain. The history is provided by the patient. No language interpreter was used.  Back Pain Associated symptoms: no abdominal pain, no chest pain, no numbness and no weakness     HPI Comments:  Morgan Burgess is a 34 y.o. female who present to the Emergency Department complaining of a fall. 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 She currently has radiating 9/10 low back pain and sore neck pain and right shoulder pain.  She has taken ibuprofen and tylenol without relief to pain. She has a rash to neck that was present prior to fall.  States the fall aggravates her rash.  .  She denies CP and abdominal pain.   Past Medical History  Diagnosis Date  . Depression    Past Surgical History  Procedure Laterality Date  . Cholecystectomy  2004  . Gastric bypass  2004  . Dilation and curettage of uterus  2012   Family History  Problem Relation Age of Onset  . Hypertension Mother   . Diabetes Neg Hx   . Heart attack Neg Hx   . Hypertension Father    History  Substance Use Topics  . Smoking status: Never Smoker   . Smokeless tobacco: Not on file  . Alcohol Use: No   OB History    No data available     Review of Systems  Cardiovascular: Negative for chest pain.  Gastrointestinal: Negative for nausea and abdominal pain.  Musculoskeletal: Positive for myalgias, back pain, arthralgias and neck pain.  Neurological: Negative for weakness and numbness.   Allergies  Bee venom and Latex  Home Medications   Prior to Admission medications   Medication Sig Start  Date End Date Taking? Authorizing Provider  amoxicillin-clavulanate (AUGMENTIN) 500-125 MG per tablet Take 1 tablet (500 mg total) by mouth 2 (two) times daily. 07/20/15   Sheliah Hatch, MD  citalopram (CELEXA) 20 MG tablet Take 1 tablet (20 mg total) by mouth daily. 06/01/15   Sheliah Hatch, MD  ketoconazole (NIZORAL) 2 % shampoo Apply 1 application topically 2 (two) times a week. 05/12/15   Waldon Merl, PA-C  levocetirizine (XYZAL) 5 MG tablet Take 1 tablet (5 mg total) by mouth every evening. 04/10/15   Sheliah Hatch, MD  LORazepam (ATIVAN) 0.5 MG tablet Take 1 tablet (0.5 mg total) by mouth every 8 (eight) hours as needed. 06/01/15   Sheliah Hatch, MD  oxyCODONE-acetaminophen (PERCOCET) 7.5-325 MG per tablet Take 1 tablet by mouth every 4 (four) hours as needed for severe pain. 07/20/15   Sheliah Hatch, MD  zolpidem (AMBIEN) 10 MG tablet Take 1 tablet (10 mg total) by mouth at bedtime. 06/01/15   Sheliah Hatch, MD   BP 142/96 mmHg  Pulse 101  Temp(Src) 98.3 F (36.8 C) (Oral)  Resp 18  Ht  (1.676 m)  Wt 160 lb (72.576 kg)  BMI 25.84 kg/m2  SpO2 99% Physical Exam  Constitutional: She is oriented to  person, place, and time. She appears well-developed and well-nourished. No distress.  HENT:  Head: Normocephalic and atraumatic.  No scalp tenderness.  Eyes: Conjunctivae and EOM are normal.  Neck: Neck supple.  Cardiovascular: Normal rate.   Pulmonary/Chest: Effort normal.  Abdominal: Soft. There is no tenderness.  Musculoskeletal: Normal range of motion.  Abrasion noted to left upper back adjacent to the neck. Dose not appear infected. Tenderness noted to upper lumbar spine. No crepitus and. Tenderness noted to bilateral lumbar paraspinal muscle.   Neurological: She is alert and oriented to person, place, and time.  Skin: Skin is warm and dry.  Psychiatric: She has a normal mood and affect. Her behavior is normal.  Nursing note and vitals  reviewed.   ED Course  Procedures (including critical care time) DIAGNOSTIC STUDIES: Oxygen Saturation is 99% on RA, normal by my interpretation.    COORDINATION OF CARE: 9:12 PM-patient reported having a mechanical fall 2 days ago injuring her mid and low back not improved with outpatient treatment. Pt advised of plan for treatment which includes and X-ray and pt agrees.  Looking at [facility] drug database, patient recently received 30 tablets of Percocet 7.5-325 mg for dental infection approximately 11 days ago.  10:34 PM Xray of low spine without acute fx/disocation.  Pt reassured.  RICE therapy discussed.  outpt f/u recommended.  Pt able to ambulate.   Labs Review Labs Reviewed - No data to display  Imaging Review Dg Lumbar Spine Complete  08/01/2015   CLINICAL DATA:  Tripped over chair at [facility]. Lower back pain, radiating through the left leg. Initial encounter.  EXAM: LUMBAR SPINE - COMPLETE 4+ VIEW  COMPARISON:  None.  FINDINGS: There is no evidence of fracture or subluxation. Vertebral bodies demonstrate normal height and alignment. Intervertebral disc spaces are preserved. The visualized neural foramina are grossly unremarkable in appearance.  The visualized bowel gas pattern is unremarkable in appearance; air and stool are noted within the colon. The sacroiliac joints are within normal limits. Clips are noted within the right upper quadrant, reflecting prior cholecystectomy. An intrauterine device is noted overlying the mid pelvis.  IMPRESSION: No evidence of fracture or subluxation along the lumbar spine.   Electronically Signed   By: Roanna Raider M.D.   On: 08/01/2015 22:01     EKG Interpretation None      MDM   Final diagnoses:  Fall  Lower back injury, initial encounter    BP 142/96 mmHg  Pulse 101  Temp(Src) 98.3 F (36.8 C) (Oral)  Resp 18  Ht 5\' 6"  (1.676 m)  Wt 160 lb (72.576 kg)  BMI 25.84 kg/m2  SpO2 99%  I have reviewed nursing notes and  vital signs. I personally viewed the imaging tests through PACS system and agrees with radiologist's intepretation I reviewed available ER/hospitalization records through the EMR   I personally performed the services described in this documentation, which was scribed in my presence. The recorded information has been reviewed and is accurate.     Fayrene Helper, PA-C 08/01/15 2235  Richardean Canal, MD 08/01/15 [id number]

## 2015-08-01 NOTE — ED Notes (Signed)
Pt arrived to the ED with a complaint of a fall.  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  Pt has abrasions on the left neck shoulder area.  Pt states fall happen as an accident.  Pt denies LOC.  Pt states fall was two days ago.

## 2015-08-01 NOTE — Discharge Instructions (Signed)
Contusion °A contusion is a deep bruise. Contusions are the result of an injury that caused bleeding under the skin. The contusion may turn blue, purple, or yellow. Minor injuries will give you a painless contusion, but more severe contusions may stay painful and swollen for a few weeks.  °CAUSES  °A contusion is usually caused by a blow, trauma, or direct force to an area of the body. °SYMPTOMS  °· Swelling and redness of the injured area. °· Bruising of the injured area. °· Tenderness and soreness of the injured area. °· Pain. °DIAGNOSIS  °The diagnosis can be made by taking a history and physical exam. An X-ray, CT scan, or MRI may be needed to determine if there were any associated injuries, such as fractures. °TREATMENT  °Specific treatment will depend on what area of the body was injured. In general, the best treatment for a contusion is resting, icing, elevating, and applying cold compresses to the injured area. Over-the-counter medicines may also be recommended for pain control. Ask your caregiver what the best treatment is for your contusion. °HOME CARE INSTRUCTIONS  °· Put ice on the injured area. °¨ Put ice in a plastic bag. °¨ Place a towel between your skin and the bag. °¨ Leave the ice on for 15-20 minutes, 3-4 times a day, or as directed by your health care provider. °· Only take over-the-counter or prescription medicines for pain, discomfort, or fever as directed by your caregiver. Your caregiver may recommend avoiding anti-inflammatory medicines (aspirin, ibuprofen, and naproxen) for 48 hours because these medicines may increase bruising. °· Rest the injured area. °· If possible, elevate the injured area to reduce swelling. °SEEK IMMEDIATE MEDICAL CARE IF:  °· You have increased bruising or swelling. °· You have pain that is getting worse. °· Your swelling or pain is not relieved with medicines. °MAKE SURE YOU:  °· Understand these instructions. °· Will watch your condition. °· Will get help right  away if you are not doing well or get worse. °Document Released: 09/21/2005 Document Revised: 12/17/2013 Document Reviewed: 10/17/2011 °ExitCare® Patient Information ©2015 [employer]. This information is not intended to replace advice given to you by your health care provider. Make sure you discuss any questions you have with your health care provider. ° °

## 2015-08-17 ENCOUNTER — Other Ambulatory Visit: Payer: Self-pay | Admitting: General Practice

## 2015-08-17 MED ORDER — CITALOPRAM HYDROBROMIDE 40 MG PO TABS: 40.0000 mg | ORAL_TABLET | Freq: Every day | ORAL | Status: DC

## 2015-08-17 MED ORDER — IBUPROFEN 800 MG PO TABS: 800.0000 mg | ORAL_TABLET | Freq: Three times a day (TID) | ORAL | Status: DC | PRN

## 2015-09-12 ENCOUNTER — Other Ambulatory Visit: Payer: Self-pay | Admitting: Family Medicine

## 2015-09-14 NOTE — Telephone Encounter (Signed)
Medication filled to pharmacy as requested.   

## 2015-11-10 ENCOUNTER — Other Ambulatory Visit: Payer: Self-pay | Admitting: Family Medicine

## 2015-11-11 NOTE — Telephone Encounter (Signed)
Last ov 07/20/15 (infected tooth) Zolpidem last filled 06/01/15 #30 with 3

## 2015-11-11 NOTE — Telephone Encounter (Signed)
Medication filled to pharmacy as requested.   

## 2015-11-13 ENCOUNTER — Telehealth: Payer: Self-pay | Admitting: Family Medicine

## 2015-11-13 NOTE — Telephone Encounter (Signed)
-----   Message from Geannie RisenJessica L Brodmerkel, [place] sent at 11/11/2015  8:53 AM EST ----- Regarding: FW: New Patient Request  Contact: [phone]   ----- Message -----    From: Sheliah HatchKatherine E Tabori, MD    Sent: 11/11/2015   8:35 AM      To: Geannie RisenJessica L Brodmerkel, CMA Subject: RE: New Patient Request                        Ok to establish if ok w/ new location  kt ----- Message -----    From: Geannie RisenJessica L Brodmerkel, CMA    Sent: 11/11/2015   8:22 AM      To: Sheliah HatchKatherine E Tabori, MD Subject: FW: New Patient Request                          ----- Message -----    From: Metro KungShiquita C Johnson    Sent: 11/10/2015   3:20 PM      To: Geannie RisenJessica L Brodmerkel, CMA Subject: New Patient Request                            Caller name: [facility]    Can be reached: [phone]  Reason for call: She is calling on behalf of the above pt's spouse. She says that you are his wife's and children's  PCP (above pt) I informed that you are not currently accepting new patients but he would like to know if you would take him on as a pt considering

## 2015-11-13 NOTE — Telephone Encounter (Signed)
Called pt lvm for call back to schedule appt to be established with Dr. Beverely Lowabori

## 2015-11-23 ENCOUNTER — Ambulatory Visit (INDEPENDENT_AMBULATORY_CARE_PROVIDER_SITE_OTHER): Payer: BLUE CROSS/BLUE SHIELD | Admitting: Family Medicine

## 2015-11-23 ENCOUNTER — Encounter: Payer: Self-pay | Admitting: Family Medicine

## 2015-11-23 ENCOUNTER — Ambulatory Visit: Payer: BLUE CROSS/BLUE SHIELD | Admitting: Family Medicine

## 2015-11-23 VITALS — BP 122/82 | HR 82 | Temp 98.0°F | Resp 16 | Ht 66.0 in | Wt 240.4 lb

## 2015-11-23 DIAGNOSIS — Z9884 Bariatric surgery status: Secondary | ICD-10-CM | POA: Diagnosis not present

## 2015-11-23 DIAGNOSIS — Z0000 Encounter for general adult medical examination without abnormal findings: Secondary | ICD-10-CM | POA: Insufficient documentation

## 2015-11-23 MED ORDER — ZOLPIDEM TARTRATE 10 MG PO TABS: 10.0000 mg | ORAL_TABLET | Freq: Every day | ORAL | Status: DC

## 2015-11-23 MED ORDER — LORAZEPAM 0.5 MG PO TABS: 0.5000 mg | ORAL_TABLET | Freq: Three times a day (TID) | ORAL | Status: DC | PRN

## 2015-11-23 MED ORDER — LEVOCETIRIZINE DIHYDROCHLORIDE 5 MG PO TABS
5.0000 mg | ORAL_TABLET | Freq: Every evening | ORAL | Status: DC
Start: 2015-11-23 — End: 2016-05-10

## 2015-11-23 NOTE — Progress Notes (Signed)
   Subjective:    Patient ID: Morgan Burgess, female    DOB: 09/28/1981, 34 y.o.   MRN: [id number]  HPI CPE- UTD on GYN.     Review of Systems Patient reports no vision/ hearing changes, adenopathy,fever, weight change,  persistant/recurrent hoarseness , swallowing issues, chest pain, palpitations, edema, persistant/recurrent cough, hemoptysis, dyspnea (rest/exertional/paroxysmal nocturnal), gastrointestinal bleeding (melena, rectal bleeding), abdominal pain, significant heartburn, bowel changes, GU symptoms (dysuria, hematuria, incontinence), Gyn symptoms (abnormal  bleeding, pain),  syncope, focal weakness, memory loss, numbness & tingling, skin/hair/nail changes, abnormal bruising or bleeding, anxiety, or depression.     Objective:   Physical Exam General Appearance:    Alert, cooperative, no distress, appears stated age, obese  Head:    Normocephalic, without obvious abnormality, atraumatic  Eyes:    PERRL, conjunctiva/corneas clear, EOM's intact, fundi    benign, both eyes  Ears:    Normal TM's and external ear canals, both ears  Nose:   Nares normal, septum midline, mucosa normal, no drainage    or sinus tenderness  Throat:   Lips, mucosa, and tongue normal; teeth and gums normal  Neck:   Supple, symmetrical, trachea midline, no adenopathy;    Thyroid: no enlargement/tenderness/nodules  Back:     Symmetric, no curvature, ROM normal, no CVA tenderness  Lungs:     Clear to auscultation bilaterally, respirations unlabored  Chest Wall:    No tenderness or deformity   Heart:    Regular rate and rhythm, S1 and S2 normal, no murmur, rub   or gallop  Breast Exam:    Deferred to GYN  Abdomen:     Soft, non-tender, bowel sounds active all four quadrants,    no masses, no organomegaly  Genitalia:    Deferred to GYN  Rectal:    Extremities:   Extremities normal, atraumatic, no cyanosis or edema  Pulses:   2+ and symmetric all extremities  Skin:   Skin color, texture, turgor normal, no  rashes or lesions  Lymph nodes:   Cervical, supraclavicular, and axillary nodes normal  Neurologic:   CNII-XII intact, normal strength, sensation and reflexes    throughout          Assessment & Plan:

## 2015-11-23 NOTE — Progress Notes (Signed)
Pre visit review using our clinic review tool, if applicable. No additional management support is needed unless otherwise documented below in the visit note. 

## 2015-11-23 NOTE — Assessment & Plan Note (Signed)
Check labs.  Replete B12, Vit D, iron PRN.

## 2015-11-23 NOTE — Assessment & Plan Note (Signed)
Pt's PE WNL w/ exception of obesity.  UTD on GYN.  Declines flu.  Check labs- pt prefers to have these drawn at her office.  Anticipatory guidance provided.

## 2015-11-23 NOTE — Patient Instructions (Signed)
Follow up in 1 year or as needed Have your labs done and fax them to me Try and make healthy food choices and get regular exercise Call with any questions or concerns If you want to join us at the new [place] office, any scheduled appointments will automatically transfer and we will see you at [address], [place], [place] [id number] (OPENING 12/29/15) Happy Holidays!!!

## 2015-11-30 ENCOUNTER — Encounter: Payer: Self-pay | Admitting: Family Medicine

## 2015-11-30 ENCOUNTER — Other Ambulatory Visit: Payer: Self-pay | Admitting: Family Medicine

## 2015-12-01 LAB — CBC AND DIFFERENTIAL
HEMATOCRIT: 30 % — AB (ref 36–46)
HEMOGLOBIN: 9.9 g/dL — AB (ref 12.0–16.0)
Platelets: 200 10*3/uL (ref 150–399)
WBC: 4.8 10^3/mL

## 2015-12-01 LAB — BASIC METABOLIC PANEL
BUN: 9 mg/dL (ref 4–21)
Creatinine: 0.7 mg/dL (ref <=1.1)
Glucose: 83 mg/dL
Potassium: 4 mmol/L (ref 3.4–5.3)
SODIUM: 139 mmol/L (ref 137–147)

## 2015-12-01 LAB — LIPID PANEL
CHOLESTEROL: 153 mg/dL (ref 0–200)
HDL: 73 mg/dL — AB (ref 35–70)
LDL CALC: 71 mg/dL
LDL/HDL RATIO: 2.1
TRIGLYCERIDES: 43 mg/dL (ref 40–160)

## 2015-12-01 LAB — HEPATIC FUNCTION PANEL
ALT: 7 U/L (ref 7–35)
AST: 16 U/L (ref 13–35)
Alkaline Phosphatase: 94 U/L (ref 25–125)
Bilirubin, Total: 0.5 mg/dL

## 2015-12-01 LAB — TSH: TSH: 1.41 u[IU]/mL (ref <=5.90)

## 2015-12-01 LAB — HEMOGLOBIN A1C: Hgb A1c MFr Bld: 5.4 % (ref 4.0–6.0)

## 2015-12-01 MED ORDER — ZOLPIDEM TARTRATE 10 MG PO TABS: 10.0000 mg | ORAL_TABLET | Freq: Every day | ORAL | Status: DC

## 2015-12-01 MED ORDER — LORAZEPAM 0.5 MG PO TABS: 0.5000 mg | ORAL_TABLET | Freq: Three times a day (TID) | ORAL | Status: DC | PRN

## 2015-12-01 MED ORDER — IBUPROFEN 800 MG PO TABS: 800.0000 mg | ORAL_TABLET | Freq: Three times a day (TID) | ORAL | Status: DC | PRN

## 2015-12-01 NOTE — Telephone Encounter (Signed)
Medication filled to pharmacy as requested.   

## 2015-12-02 MED ORDER — VITAMIN D (ERGOCALCIFEROL) 1.25 MG (50000 UNIT) PO CAPS: 50000.0000 [IU] | ORAL_CAPSULE | ORAL | Status: DC

## 2015-12-02 NOTE — Telephone Encounter (Signed)
Medication filled to pharmacy as requested.   

## 2015-12-08 ENCOUNTER — Encounter: Payer: Self-pay | Admitting: Family Medicine

## 2015-12-09 ENCOUNTER — Other Ambulatory Visit: Payer: Self-pay | Admitting: General Practice

## 2015-12-09 MED ORDER — FERROUS SULFATE 325 (65 FE) MG PO TABS: 325.0000 mg | ORAL_TABLET | Freq: Every day | ORAL | Status: DC

## 2015-12-11 ENCOUNTER — Encounter: Payer: Self-pay | Admitting: General Practice

## 2015-12-18 ENCOUNTER — Encounter: Payer: BLUE CROSS/BLUE SHIELD | Admitting: Family Medicine

## 2016-02-27 ENCOUNTER — Other Ambulatory Visit: Payer: Self-pay | Admitting: Family Medicine

## 2016-02-29 NOTE — Telephone Encounter (Signed)
Medication filled to pharmacy as requested.   

## 2016-04-29 ENCOUNTER — Encounter: Payer: Self-pay | Admitting: Family Medicine

## 2016-04-29 MED ORDER — ZOLPIDEM TARTRATE 10 MG PO TABS: 10.0000 mg | ORAL_TABLET | Freq: Every day | ORAL | Status: DC

## 2016-04-29 NOTE — Telephone Encounter (Signed)
Medication filled to pharmacy as requested.   

## 2016-04-29 NOTE — Telephone Encounter (Signed)
Last OV 11/23/15 ambien last filled 12/01/15 #30 with 1

## 2016-05-10 ENCOUNTER — Other Ambulatory Visit: Payer: Self-pay | Admitting: Family Medicine

## 2016-05-10 NOTE — Telephone Encounter (Signed)
Medication filled to pharmacy as requested.   

## 2016-06-27 ENCOUNTER — Encounter: Payer: Self-pay | Admitting: Family Medicine

## 2016-06-27 ENCOUNTER — Other Ambulatory Visit: Payer: Self-pay | Admitting: Family Medicine

## 2016-06-29 NOTE — Telephone Encounter (Signed)
Last OV 11/23/15 Zolpidem last filled 04/29/16 #30 with 1

## 2016-06-29 NOTE — Telephone Encounter (Signed)
Last OV 11/23/15 ambien last filled 04/29/16 #30 with 1

## 2016-06-30 ENCOUNTER — Other Ambulatory Visit: Payer: Self-pay | Admitting: Family Medicine

## 2016-06-30 NOTE — Telephone Encounter (Signed)
Last OV 11/23/15 Zolpidem last filled 06/29/16 #30 with 0

## 2016-07-22 ENCOUNTER — Other Ambulatory Visit: Payer: Self-pay | Admitting: Family Medicine

## 2016-07-22 NOTE — Telephone Encounter (Addendum)
Patient requesting RF of Vit D 50,000 Units and also Ambien per MyChart.  Patient shouldn't need Ambien until 07/28/16 per last RX.  Also, I wasn't sure if I should RF Vit D because I didn't see any recent labs.   Please advise.   * see Pt RX request *

## 2016-07-27 MED ORDER — ZOLPIDEM TARTRATE 10 MG PO TABS: 10.0000 mg | ORAL_TABLET | Freq: Every day | ORAL | 1 refills | Status: DC

## 2016-07-27 NOTE — Telephone Encounter (Signed)
Encounter documented in [place].

## 2016-07-27 NOTE — Telephone Encounter (Signed)
Last OV 10/2015 ambien last filled 06/29/16 #30 with 0  Pt would like to have refills on this, please advise

## 2016-08-25 ENCOUNTER — Encounter: Payer: Self-pay | Admitting: Family Medicine

## 2016-08-26 ENCOUNTER — Ambulatory Visit: Payer: BLUE CROSS/BLUE SHIELD | Admitting: Family Medicine

## 2016-09-14 ENCOUNTER — Ambulatory Visit: Payer: Self-pay | Admitting: Family Medicine

## 2016-09-15 ENCOUNTER — Ambulatory Visit: Payer: Self-pay | Admitting: Family Medicine

## 2016-09-16 ENCOUNTER — Encounter: Payer: Self-pay | Admitting: Family Medicine

## 2016-09-16 ENCOUNTER — Encounter: Payer: Self-pay | Admitting: General Practice

## 2016-09-16 ENCOUNTER — Ambulatory Visit (INDEPENDENT_AMBULATORY_CARE_PROVIDER_SITE_OTHER): Payer: BLUE CROSS/BLUE SHIELD | Admitting: Family Medicine

## 2016-09-16 VITALS — BP 121/80 | HR 72 | Temp 98.1°F | Resp 16 | Ht 66.0 in | Wt 258.5 lb

## 2016-09-16 DIAGNOSIS — F411 Generalized anxiety disorder: Secondary | ICD-10-CM

## 2016-09-16 DIAGNOSIS — F900 Attention-deficit hyperactivity disorder, predominantly inattentive type: Secondary | ICD-10-CM | POA: Diagnosis not present

## 2016-09-16 DIAGNOSIS — F909 Attention-deficit hyperactivity disorder, unspecified type: Secondary | ICD-10-CM | POA: Insufficient documentation

## 2016-09-16 DIAGNOSIS — Z9884 Bariatric surgery status: Secondary | ICD-10-CM | POA: Diagnosis not present

## 2016-09-16 DIAGNOSIS — Z0184 Encounter for antibody response examination: Secondary | ICD-10-CM

## 2016-09-16 LAB — IBC PANEL
Iron: 33 ug/dL — ABNORMAL LOW (ref 42–145)
Saturation Ratios: 7.6 % — ABNORMAL LOW (ref 20.0–50.0)
Transferrin: 312 mg/dL (ref 212.0–360.0)

## 2016-09-16 LAB — HEPATIC FUNCTION PANEL
ALK PHOS: 85 U/L (ref 39–117)
ALT: 6 U/L (ref 0–35)
AST: 13 U/L (ref 0–37)
Albumin: 3.9 g/dL (ref 3.5–5.2)
BILIRUBIN DIRECT: 0.1 mg/dL (ref 0.0–0.3)
BILIRUBIN TOTAL: 0.5 mg/dL (ref 0.2–1.2)
Total Protein: 7 g/dL (ref 6.0–8.3)

## 2016-09-16 LAB — CBC WITH DIFFERENTIAL/PLATELET
BASOS PCT: 0.5 % (ref 0.0–3.0)
Basophils Absolute: 0 10*3/uL (ref 0.0–0.1)
Eosinophils Absolute: 0.1 10*3/uL (ref 0.0–0.7)
Eosinophils Relative: 1.3 % (ref 0.0–5.0)
HEMATOCRIT: 29.6 % — AB (ref 36.0–46.0)
Hemoglobin: 9.7 g/dL — ABNORMAL LOW (ref 12.0–15.0)
LYMPHS PCT: 38.3 % (ref 12.0–46.0)
Lymphs Abs: 1.7 10*3/uL (ref 0.7–4.0)
MCHC: 32.8 g/dL (ref 30.0–36.0)
MCV: 79.4 fl (ref 78.0–100.0)
MONOS PCT: 10.5 % (ref 3.0–12.0)
Monocytes Absolute: 0.5 10*3/uL (ref 0.1–1.0)
NEUTROS PCT: 49.4 % (ref 43.0–77.0)
Neutro Abs: 2.2 10*3/uL (ref 1.4–7.7)
Platelets: 198 10*3/uL (ref 150.0–400.0)
RBC: 3.73 Mil/uL — ABNORMAL LOW (ref 3.87–5.11)
RDW: 15.5 % (ref 11.5–15.5)
WBC: 4.4 10*3/uL (ref 4.0–10.5)

## 2016-09-16 LAB — BASIC METABOLIC PANEL
BUN: 7 mg/dL (ref 6–23)
CALCIUM: 8.9 mg/dL (ref 8.4–10.5)
CO2: 29 mEq/L (ref 19–32)
Chloride: 105 mEq/L (ref 96–112)
Creatinine, Ser: 0.75 mg/dL (ref 0.40–1.20)
GFR: 92.65 mL/min (ref >60.00)
GLUCOSE: 84 mg/dL (ref 70–99)
Potassium: 4.5 mEq/L (ref 3.5–5.1)
SODIUM: 140 meq/L (ref 135–145)

## 2016-09-16 LAB — VITAMIN D 25 HYDROXY (VIT D DEFICIENCY, FRACTURES): VITD: 14.56 ng/mL — ABNORMAL LOW (ref 30.00–100.00)

## 2016-09-16 LAB — LIPID PANEL
CHOLESTEROL: 150 mg/dL (ref 0–200)
HDL: 68.6 mg/dL (ref >39.00)
LDL Cholesterol: 73 mg/dL (ref 0–99)
NONHDL: 81.5
Total CHOL/HDL Ratio: 2
Triglycerides: 44 mg/dL (ref 0.0–149.0)
VLDL: 8.8 mg/dL (ref 0.0–40.0)

## 2016-09-16 LAB — TSH: TSH: 1.85 u[IU]/mL (ref 0.35–4.50)

## 2016-09-16 MED ORDER — AMPHETAMINE-DEXTROAMPHET ER 15 MG PO CP24: 15.0000 mg | ORAL_CAPSULE | ORAL | 0 refills | Status: DC

## 2016-09-16 NOTE — Progress Notes (Signed)
   Subjective:    Patient ID: Morgan Burgess, female    DOB: 01/11/1981, 35 y.o.   MRN: [id number]  HPI Anxiety- chronic problem, pt stopped the Celexa due to weight gain.  (18 lbs since November).  Now having difficulty focusing on her tasks both at home and at work.  Was [facility].  Pt reports she had some increased anxiety on the medication and used benzos 'as my backup'.  Pt took 3-4 pills daily of short acting Adderall.    Hep B vaccine- pt doesn't think she has ever had this.  Needs titers for work  Obesity- BMI is now 41.  Pt reports she gained quite a bit of weight on her Celexa.  She has stopped this.  Not exercising regularly or following a particular diet.   Review of Systems For ROS see HPI     Objective:   Physical Exam  Constitutional: She is oriented to person, place, and time. She appears well-developed and well-nourished. No distress.  obese  HENT:  Head: Normocephalic and atraumatic.  Eyes: Conjunctivae and EOM are normal. Pupils are equal, round, and reactive to light.  Neck: Normal range of motion. Neck supple. No thyromegaly present.  Cardiovascular: Normal rate, regular rhythm, normal heart sounds and intact distal pulses.   No murmur heard. Pulmonary/Chest: Effort normal and breath sounds normal. No respiratory distress.  Abdominal: Soft. She exhibits no distension. There is no tenderness.  Musculoskeletal: She exhibits no edema.  Lymphadenopathy:    She has no cervical adenopathy.  Neurological: She is alert and oriented to person, place, and time.  Skin: Skin is warm and dry.  Psychiatric: She has a normal mood and affect. Her behavior is normal.  Vitals reviewed.         Assessment & Plan:

## 2016-09-16 NOTE — Progress Notes (Signed)
Pre visit review using our clinic review tool, if applicable. No additional management support is needed unless otherwise documented below in the visit note. 

## 2016-09-16 NOTE — Patient Instructions (Signed)
Follow up in 1 month to recheck attention We'll notify you of your lab results and make any changes if needed Continue to work on healthy diet and regular exercise- you can do it! Start the Adderall once daily- take in the AM If you need the Hep B, we can do it when you return in 1 month Call with any questions or concerns Hang in there!!!

## 2016-09-17 LAB — HEPATITIS B SURFACE ANTIBODY,QUALITATIVE: HEP B S AB: NEGATIVE

## 2016-09-18 ENCOUNTER — Encounter: Payer: Self-pay | Admitting: Family Medicine

## 2016-09-18 NOTE — Assessment & Plan Note (Signed)
Ongoing issue.  Pt stopped her Celexa due to weight gain.  Reports she currently feels 'ok' but we did discuss that adding a stimulant may worsen her anxiety and we may need to restart a daily controller medication.  Pt expressed understanding and is in agreement w/ plan.

## 2016-09-18 NOTE — Assessment & Plan Note (Signed)
Check labs and replete deficiencies as needed

## 2016-09-18 NOTE — Assessment & Plan Note (Signed)
New to provider, ongoing for pt.  She was previously taking 3-4 short acting adderall daily.  I explained that I was not comfortable with this and we would use the long acting formulation.  She also said that she would take the Adderall and then use Ativan to help w/ the anxiety caused by the stimulant.  I told her that this is not a viable long term option and that I don't like to treat the side effects of 1 controlled substance w/ another controlled substance.  If the adderall is causing side effects or is ineffective, we will need to consider switching to a nonstimulant medication like strattera.  Pt expressed understanding and is in agreement w/ plan.

## 2016-09-18 NOTE — Assessment & Plan Note (Signed)
Stressed need for healthy diet and regular exercise as this continues to be a problem for pt.  She stopped her Celexa thinking this was the culprit for her weight gain.  Check labs to risk stratify.  Will follow closely.

## 2016-09-19 ENCOUNTER — Other Ambulatory Visit: Payer: Self-pay | Admitting: General Practice

## 2016-09-19 DIAGNOSIS — D509 Iron deficiency anemia, unspecified: Secondary | ICD-10-CM

## 2016-09-19 MED ORDER — FERROUS SULFATE 325 (65 FE) MG PO TABS: 325.0000 mg | ORAL_TABLET | Freq: Every day | ORAL | 6 refills | Status: DC

## 2016-09-19 MED ORDER — VITAMIN D (ERGOCALCIFEROL) 1.25 MG (50000 UNIT) PO CAPS: 50000.0000 [IU] | ORAL_CAPSULE | ORAL | 0 refills | Status: DC

## 2016-09-20 ENCOUNTER — Telehealth: Payer: Self-pay | Admitting: Family Medicine

## 2016-09-20 ENCOUNTER — Other Ambulatory Visit: Payer: Self-pay | Admitting: Family

## 2016-09-20 DIAGNOSIS — D510 Vitamin B12 deficiency anemia due to intrinsic factor deficiency: Secondary | ICD-10-CM

## 2016-09-20 DIAGNOSIS — D509 Iron deficiency anemia, unspecified: Secondary | ICD-10-CM

## 2016-09-20 NOTE — Telephone Encounter (Signed)
Patient calling to ask Dr. Beverely Lowabori or her assistant to request to add on the lab tests that were just ordered by Dr. Willis Modenaincinnati from hematology office.  Pt states tests could be added on if done by 11am tomorrow morning.  Please follow up with pt and  let her know if this can be done.

## 2016-09-21 NOTE — Telephone Encounter (Signed)
After reviewing patients chart. Dr. Willis Modenaincinnati ordered Erythropoietin, Ferritin, Reticulocytes and Vitamin B12. Some of those labs need to be drawn in morning hours.  I left a message on machine for patient advising her that those labs would need to be redrawn.

## 2016-09-21 NOTE — Telephone Encounter (Signed)
Could you call and ask pt what lab tests are needed? Usually we need a lab slip with diagnosis?

## 2016-09-23 ENCOUNTER — Encounter: Payer: Self-pay | Admitting: Family Medicine

## 2016-09-26 MED ORDER — ZOLPIDEM TARTRATE 10 MG PO TABS: 10.0000 mg | ORAL_TABLET | Freq: Every day | ORAL | 1 refills | Status: DC

## 2016-09-26 NOTE — Telephone Encounter (Signed)
Medication filled to pharmacy as requested.   

## 2016-09-26 NOTE — Telephone Encounter (Signed)
Last oV 09/16/16 ambien last filled 07/27/16 #30 with 1

## 2016-09-28 ENCOUNTER — Encounter: Payer: Self-pay | Admitting: Family

## 2016-09-28 ENCOUNTER — Ambulatory Visit (HOSPITAL_BASED_OUTPATIENT_CLINIC_OR_DEPARTMENT_OTHER): Payer: BLUE CROSS/BLUE SHIELD | Admitting: Family

## 2016-09-28 ENCOUNTER — Ambulatory Visit (HOSPITAL_BASED_OUTPATIENT_CLINIC_OR_DEPARTMENT_OTHER): Payer: BLUE CROSS/BLUE SHIELD

## 2016-09-28 ENCOUNTER — Ambulatory Visit: Payer: BLUE CROSS/BLUE SHIELD

## 2016-09-28 ENCOUNTER — Other Ambulatory Visit: Payer: BLUE CROSS/BLUE SHIELD

## 2016-09-28 VITALS — BP 125/72 | HR 68 | Temp 98.2°F | Resp 16

## 2016-09-28 VITALS — BP 124/74 | HR 76 | Temp 97.5°F | Resp 16 | Ht 66.0 in | Wt 254.1 lb

## 2016-09-28 DIAGNOSIS — K909 Intestinal malabsorption, unspecified: Secondary | ICD-10-CM

## 2016-09-28 DIAGNOSIS — D509 Iron deficiency anemia, unspecified: Secondary | ICD-10-CM

## 2016-09-28 DIAGNOSIS — Z9884 Bariatric surgery status: Secondary | ICD-10-CM

## 2016-09-28 DIAGNOSIS — D510 Vitamin B12 deficiency anemia due to intrinsic factor deficiency: Secondary | ICD-10-CM

## 2016-09-28 DIAGNOSIS — K9089 Other intestinal malabsorption: Secondary | ICD-10-CM

## 2016-09-28 DIAGNOSIS — D508 Other iron deficiency anemias: Secondary | ICD-10-CM

## 2016-09-28 DIAGNOSIS — E559 Vitamin D deficiency, unspecified: Secondary | ICD-10-CM

## 2016-09-28 MED ORDER — SODIUM CHLORIDE 0.9 % IV SOLN
Freq: Once | INTRAVENOUS | Status: AC
  Administered 2016-09-28: 16:00:00 via INTRAVENOUS

## 2016-09-28 MED ORDER — SODIUM CHLORIDE 0.9 % IV SOLN
510.0000 mg | Freq: Once | INTRAVENOUS | Status: AC
  Administered 2016-09-28: 510 mg via INTRAVENOUS
  Filled 2016-09-28: qty 17

## 2016-09-28 NOTE — Patient Instructions (Signed)

## 2016-09-28 NOTE — Progress Notes (Signed)
Hematology/Oncology Consultation   Name: Morgan Burgess      MRN: [id number]    Location: Room/bed info not found  Date: 09/28/2016 Time:2:47 PM   REFERRING PHYSICIAN: Natalia Leatherwood E. 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 She had a gastric bypass 13 years ago after her first pregnancy.  She is symptomatic with fatigue, chewing ice and bruising easily.  She also has a history of pernicious anemia and vitamin D deficiency. At one time she was self administering B 12 injections.  She has received IV iron in the past but has not had an infusion since 2013.  Her mother also has history of gastric bypass and iron deficiency anemia.  She has an IUD and will only spot occasionally.  No fever, chills, n/v, cough, rash, dizziness, SOB, chest pain, palpitations, abdominal pain or changes in bowel or bladder habits.  She has maintained a good appetite but does not eat a lot of meat. She eats chicken sometimes but no red meat. She is well hydrated. She is frustrated by her weight gain of 65 lbs over the last 2 years due to fatigue. She would like to lose this once her energy improves.  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 She avoids these foods and does well. She is followed by Dr. Lurene Shadow with endocrinology for this.  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 She was anemia during her last 2 pregnancies. After giving birth to her middle daughter, she had to receive IV iron and PRBC's for a Hgb of 5.  No swelling, tenderness, numbness or tingling in her extremities. No c/o pain.  She is not a smoker and dose not drink alcoholic beverages.  She is  originally from [place] and moved to [place] 10 years ago.   ROS: All other 10 point review of systems is negative.   PAST MEDICAL HISTORY:   Past Medical History:  Diagnosis Date  . Depression     ALLERGIES: Allergies  Allergen Reactions  . Latex Hives      MEDICATIONS:  Current Outpatient Prescriptions on File Prior to Visit  Medication Sig Dispense Refill  . amphetamine-dextroamphetamine (ADDERALL XR) 15 MG 24 hr capsule Take 1 capsule by mouth every morning. 30 capsule 0  . levonorgestrel (MIRENA) 20 MCG/24HR IUD 1 each by Intrauterine route continuous.    Marland Kitchen LORazepam (ATIVAN) 0.5 MG tablet Take 1 tablet (0.5 mg total) by mouth every 8 (eight) hours as needed. 30 tablet 1  . Vitamin D, Ergocalciferol, (DRISDOL) 50000 units CAPS capsule Take 1 capsule (50,000 Units total) by mouth every 7 (seven) days. 12 capsule 0  . zolpidem (AMBIEN) 10 MG tablet Take 1 tablet (10 mg total) by mouth at bedtime. 30 tablet 1   No current facility-administered medications on file prior to visit.      PAST SURGICAL HISTORY Past Surgical History:  Procedure Laterality Date  . CHOLECYSTECTOMY  2004  . DILATION AND CURETTAGE OF UTERUS  2012  . GASTRIC BYPASS  2004    FAMILY HISTORY: Family History  Problem Relation Age of Onset  . Hypertension Mother   . Hypertension Father   .  Diabetes Neg Hx   . Heart attack Neg Hx     SOCIAL HISTORY:  reports that she has never smoked. She has never used smokeless tobacco. She reports that she does not drink alcohol or use drugs.  PERFORMANCE STATUS: The patient's performance status is 1 - Symptomatic but completely ambulatory  PHYSICAL EXAM: Most Recent Vital Signs: Blood pressure 124/74, pulse 76, temperature 97.5 F (36.4 C), temperature source Oral, resp. rate 16, height 5\' 6"  (1.676 m), weight 254 lb 1.9 oz (115.3 kg). BP 124/74 (BP Location: Left Arm, Patient Position: Sitting)   Pulse 76   Temp 97.5 F (36.4 C) (Oral)   Resp 16   Ht 5'  6" (1.676 m)   Wt 254 lb 1.9 oz (115.3 kg)   BMI 41.02 kg/m   General Appearance:    Alert, cooperative, no distress, appears stated age  Head:    Normocephalic, without obvious abnormality, atraumatic  Eyes:    PERRL, conjunctiva/corneas clear, EOM's intact, fundi    benign, both eyes        Throat:   Lips, mucosa, and tongue normal; teeth and gums normal  Neck:   Supple, symmetrical, trachea midline, no adenopathy;    thyroid:  no enlargement/tenderness/nodules; no carotid   bruit or JVD  Back:     Symmetric, no curvature, ROM normal, no CVA tenderness  Lungs:     Clear to auscultation bilaterally, respirations unlabored  Chest Wall:    No tenderness or deformity   Heart:    Regular rate and rhythm, S1 and S2 normal, no murmur, rub   or gallop     Abdomen:     Soft, non-tender, bowel sounds active all four quadrants,    no masses, no organomegaly        Extremities:   Extremities normal, atraumatic, no cyanosis or edema  Pulses:   2+ and symmetric all extremities  Skin:   Skin color, texture, turgor normal, no rashes or lesions  Lymph nodes:   Cervical, supraclavicular, and axillary nodes normal  Neurologic:   CNII-XII intact, normal strength, sensation and reflexes    throughout    LABORATORY DATA: No results found for this or any previous visit (from the past 48 hour(s)).    RADIOGRAPHY: No results found.     PATHOLOGY: None  ASSESSMENT/PLAN: Morgan Burgess is a very pleasant 35 yo white female with history of iron deficiency secondary to malabsorption - gastric bypass in 2004. Her iron saturation was 7.6% last week with a Hgb of 9.7 and MCV of 79. She is symptomatic at this time with fatigue, chewing ice and bruising easily.  She did not want repeat blood work today since she had labs on 9/22 which is fine. She states that her insurance deductible is quite high.  We will give her a dose of Feraheme today and a second dose in 8 days.  We will plan to repeat labs and check b  12 and vit D at that time as well. We will then see her several days later for follow-up and infusion if needed. She is in agreement with the plan.   All questions were answered. She will contact our office with any problems, questions or concerns. We can certainly see her much sooner if necessary.  She was discussed with and also seen by Dr. Myna HidalgoEnnever and he is in agreement with the aforementioned.   [facility],SARAH M     Addendum:  I saw and examined the patient with Sarah.  I looked at her blood smear. It looks like that she has iron deficiency anemia. She has some hypochromic and microcytic red blood cells. I do not see any nucleated red blood cells. There may been a couple hypersegmented polys. Platelets looked adequate.  Because of her gastric bypass, she just will not absorb oral iron. She's been on oral iron which I just don't think is going to be effective for her.  She has some iron studies done a couple weeks ago. Her iron saturation was only 7.6%.  She definitely will benefit from IV iron. We will go ahead and get her set up with a dose today and also a dose next week. This should get her blood count up nicely.  She wants to try to hold off on vitamin B-12 right now. I'm sure that she will benefit from this.  Her gastric bypass was 13 years ago so she clearly would be  Vitamin B-12 deficient. So far, there is no obvious neurological issues that we can see.  We will plan to get her back here in about 6 weeks. I would think that by then, her hemoglobin should be up nicely. I also think that the MCV will be higher.  We spent about 35 minutes with her. She is very nice. It was fun talking with her.  Christin Bach, MD

## 2016-10-02 ENCOUNTER — Emergency Department (INDEPENDENT_AMBULATORY_CARE_PROVIDER_SITE_OTHER): Payer: BLUE CROSS/BLUE SHIELD

## 2016-10-02 ENCOUNTER — Encounter: Payer: Self-pay | Admitting: Emergency Medicine

## 2016-10-02 ENCOUNTER — Emergency Department (INDEPENDENT_AMBULATORY_CARE_PROVIDER_SITE_OTHER)
Admission: EM | Admit: 2016-10-02 | Discharge: 2016-10-02 | Disposition: A | Discharge status: Home / Self Care | Payer: BLUE CROSS/BLUE SHIELD | Source: Home / Self Care | Attending: Family Medicine | Admitting: Family Medicine

## 2016-10-02 DIAGNOSIS — R059 Cough, unspecified: Secondary | ICD-10-CM

## 2016-10-02 DIAGNOSIS — R062 Wheezing: Secondary | ICD-10-CM

## 2016-10-02 DIAGNOSIS — R509 Fever, unspecified: Secondary | ICD-10-CM | POA: Diagnosis not present

## 2016-10-02 DIAGNOSIS — R05 Cough: Secondary | ICD-10-CM

## 2016-10-02 DIAGNOSIS — R0602 Shortness of breath: Secondary | ICD-10-CM

## 2016-10-02 MED ORDER — ALBUTEROL SULFATE (2.5 MG/3ML) 0.083% IN NEBU: 2.5000 mg | INHALATION_SOLUTION | Freq: Four times a day (QID) | RESPIRATORY_TRACT | 12 refills | Status: DC | PRN

## 2016-10-02 MED ORDER — PREDNISONE 20 MG PO TABS: ORAL_TABLET | ORAL | 0 refills | Status: DC

## 2016-10-02 MED ORDER — ALBUTEROL SULFATE HFA 108 (90 BASE) MCG/ACT IN AERS: 1.0000 | INHALATION_SPRAY | Freq: Four times a day (QID) | RESPIRATORY_TRACT | 0 refills | Status: DC | PRN

## 2016-10-02 MED ORDER — BENZONATATE 100 MG PO CAPS: 100.0000 mg | ORAL_CAPSULE | Freq: Three times a day (TID) | ORAL | 0 refills | Status: DC

## 2016-10-02 MED ORDER — IPRATROPIUM-ALBUTEROL 0.5-2.5 (3) MG/3ML IN SOLN: 3.0000 mL | Freq: Four times a day (QID) | RESPIRATORY_TRACT | Status: DC

## 2016-10-02 MED ORDER — IPRATROPIUM-ALBUTEROL 0.5-2.5 (3) MG/3ML IN SOLN
3.0000 mL | Freq: Once | RESPIRATORY_TRACT | Status: AC
  Administered 2016-10-02: 3 mL via RESPIRATORY_TRACT

## 2016-10-02 NOTE — ED Provider Notes (Signed)
CSN: [id number]     Arrival date & time 10/02/16  1427 History   First MD Initiated Contact with Patient 10/02/16 1512     Chief Complaint  Patient presents with  . Shortness of Breath   (Consider location/radiation/quality/duration/timing/severity/associated sxs/prior Treatment) HPI  Morgan Burgess is a 35 y.o. female presenting to UC with c/o intermittent SOB that is worse with lying down, associated mild intermittent productive cough, hoarse voice, and mild sore throat.  Pt reports having an iron infusion on Wednesday 09/28/16 and was given a new medication. She also notes due to a time constrained she was not given a "pre-treatment" and had the transfusion performed faster than normal. She developed a headache that evening, which has persisted over the last few days.  On Friday, 09/30/16, she felt like her throat was closing and she could not breath, she had a panic attack and started to hyperventalate. [facility] as there was a 90 minute wait. She was able to calm herself down in the parking lot.  [facility] dx with swollen vocal cords and was given oral Decadron. She notes her voice is much improved today, however, she had a temp of 101*F this morning and productive cough with green sputum.  Hem/onco. Encouraged her to be seen again today to r/o bacterial infection. Pt is to f/u tomorrow with hem/onc.    Past Medical History:  Diagnosis Date  . Depression    Past Surgical History:  Procedure Laterality Date  . CHOLECYSTECTOMY  2004  . DILATION AND CURETTAGE OF UTERUS  2012  . GASTRIC BYPASS  2004   Family History  Problem Relation Age of Onset  . Hypertension Mother   . Hypertension Father   . Diabetes Neg Hx   . Heart attack Neg Hx    Social History  Substance Use Topics  . Smoking status: Never Smoker  . Smokeless tobacco: Never Used  . Alcohol use No   OB History    No data available     Review of Systems   Constitutional: Negative for chills and fever.  HENT: Positive for congestion and sore throat. Negative for ear pain, trouble swallowing and voice change.   Respiratory: Positive for cough and shortness of breath.   Cardiovascular: Positive for chest pain. Negative for palpitations.  Gastrointestinal: Negative for abdominal pain, diarrhea, nausea and vomiting.  Musculoskeletal: Negative for arthralgias, back pain and myalgias.  Skin: Negative for rash.  Neurological: Positive for headaches. Negative for dizziness and light-headedness.    Allergies  Latex  Home Medications   Prior to Admission medications   Medication Sig Start Date End Date Taking? Authorizing Provider  albuterol (PROVENTIL HFA;VENTOLIN HFA) 108 (90 Base) MCG/ACT inhaler Inhale 1-2 puffs into the lungs every 6 (six) hours as needed for wheezing or shortness of breath. 10/02/16   Junius FinnerErin O'Malley, PA-C  albuterol (PROVENTIL) (2.5 MG/3ML) 0.083% nebulizer solution Take 3 mLs (2.5 mg total) by nebulization every 6 (six) hours as needed for wheezing or shortness of breath. 10/02/16   Junius FinnerErin O'Malley, PA-C  amphetamine-dextroamphetamine (ADDERALL XR) 15 MG 24 hr capsule Take 1 capsule by mouth every morning. 09/16/16   Sheliah HatchKatherine E Tabori, MD  benzonatate (TESSALON) 100 MG capsule Take 1-2 capsules (100-200 mg total) by mouth every 8 (eight) hours. 10/02/16   Junius FinnerErin O'Malley, PA-C  levonorgestrel (MIRENA) 20 MCG/24HR IUD 1 each by Intrauterine route continuous.    Historical Provider, MD  LORazepam (ATIVAN)  0.5 MG tablet Take 1 tablet (0.5 mg total) by mouth every 8 (eight) hours as needed. 12/01/15   Sheliah Hatch, MD  predniSONE (DELTASONE) 20 MG tablet 3 tabs po day one, then 2 po daily x 4 days 10/02/16   Junius Finner, PA-C  Vitamin D, Ergocalciferol, (DRISDOL) 50000 units CAPS capsule Take 1 capsule (50,000 Units total) by mouth every 7 (seven) days. 09/19/16   Sheliah Hatch, MD  zolpidem (AMBIEN) 10 MG tablet Take 1 tablet  (10 mg total) by mouth at bedtime. 09/26/16   Sheliah Hatch, MD   Meds Ordered and Administered this Visit   Medications  ipratropium-albuterol (DUONEB) 0.5-2.5 (3) MG/3ML nebulizer solution 3 mL (3 mLs Nebulization Given 10/02/16 1541)    BP 125/81 (BP Location: Left Arm)   Pulse 74   Temp 97.9 F (36.6 C) (Oral)   Ht 5\' 7"  (1.702 m)   Wt 251 lb 4 oz (114 kg)   SpO2 100%   BMI 39.35 kg/m  No data found.   Physical Exam  Constitutional: She appears well-developed and well-nourished. No distress.  HENT:  Head: Normocephalic and atraumatic.  Right Ear: Tympanic membrane normal.  Left Ear: Tympanic membrane normal.  Nose: Nose normal.  Mouth/Throat: Uvula is midline, oropharynx is clear and moist and mucous membranes are normal.  Eyes: Conjunctivae are normal. No scleral icterus.  Neck: Normal range of motion. Neck supple.  Hoarse voice but no stridor.  Cardiovascular: Normal rate, regular rhythm and normal heart sounds.   Pulmonary/Chest: Effort normal. No stridor. No respiratory distress. She has wheezes ( faint wheeze in upper lung fields bilaterally). She has no rales. She exhibits no tenderness.  Abdominal: Soft. She exhibits no distension. There is no tenderness.  Musculoskeletal: Normal range of motion.  Lymphadenopathy:    She has no cervical adenopathy.  Neurological: She is alert.  Skin: Skin is warm and dry. She is not diaphoretic.  Nursing note and vitals reviewed.  Wheeze on exam, faint  Urgent Care Course   Clinical Course    Procedures (including critical care time)  Labs Review Labs Reviewed - No data to display  Imaging Review Dg Chest 2 View  Result Date: 10/02/2016 CLINICAL DATA:  Pt states that since Friday she has had sob with a productive cough and fever. EXAM: CHEST  2 VIEW COMPARISON:  None. FINDINGS: The heart size and mediastinal contours are within normal limits. Both lungs are clear. No pleural effusion or pneumothorax. The  visualized skeletal structures are unremarkable. IMPRESSION: No active cardiopulmonary disease. Electronically Signed   By: Amie Portland M.D.   On: 10/02/2016 15:33     MDM   1. SOB (shortness of breath)   2. Wheeze   3. Cough    Pt c/o worsening shortness of breath and cough after having an infusion last week. Pt given oral decadron yesterday for hoarse voice and "swollen vocal cords"   Pt has mildly hoarse voice on exam but no stridor. Faint wheeze.  CXR: normal O2 Sat is 100% on RA, HR- 74 Doubt PE  Duoneb given in UC. Wheeze resolved.  pt states she feels somewhat better. Symptoms possibly from viral infection.  Rx: tessalon, prednisone, and albuterol Encouraged f/u with Hem/onc clinic tomorrow as recommended by on-call nurse this morning. Discussed symptoms that warrant emergent care in the ED. Patient verbalized understanding and agreement with treatment plan.     Junius Finner, PA-C 10/02/16 1744

## 2016-10-02 NOTE — ED Triage Notes (Signed)
Pt c/o SOB x 2 days, had an iron infusion on Wednesday with a new med, it was given pretty fast and only waited about 5 minutes after infusion.  Pt developed a migraine after treatment which is typical for pt.  Pt began having SOB, panic attack and hyperventalating Friday night, [facility], left before being seen.  Same sxs occurred again on Saturday, went to [facility], dx w/swollen vocal cords, unable to speak, given oral Decadron w/some relief.  Pt had a 101 temp this am and a productive cough w/green mucous.  Advised to be seen.

## 2016-10-05 ENCOUNTER — Telehealth: Payer: Self-pay | Admitting: *Deleted

## 2016-10-05 NOTE — Telephone Encounter (Signed)
Spoke to pt she reports that she is feeling better. To call back if she has any questions or concerns. Clemens Catholichristy Valbona Slabach, LPN

## 2016-10-06 ENCOUNTER — Ambulatory Visit: Payer: BLUE CROSS/BLUE SHIELD

## 2016-10-07 ENCOUNTER — Ambulatory Visit: Payer: BLUE CROSS/BLUE SHIELD

## 2016-10-10 ENCOUNTER — Encounter: Payer: Self-pay | Admitting: Family Medicine

## 2016-10-10 ENCOUNTER — Ambulatory Visit (INDEPENDENT_AMBULATORY_CARE_PROVIDER_SITE_OTHER): Payer: BLUE CROSS/BLUE SHIELD | Admitting: Family Medicine

## 2016-10-10 VITALS — BP 124/76 | HR 78 | Temp 98.0°F | Resp 16 | Ht 67.0 in | Wt 252.1 lb

## 2016-10-10 DIAGNOSIS — F900 Attention-deficit hyperactivity disorder, predominantly inattentive type: Secondary | ICD-10-CM | POA: Diagnosis not present

## 2016-10-10 MED ORDER — AMPHETAMINE-DEXTROAMPHETAMINE 15 MG PO TABS: 15.0000 mg | ORAL_TABLET | Freq: Three times a day (TID) | ORAL | 0 refills | Status: DC

## 2016-10-10 NOTE — Progress Notes (Signed)
Pre visit review using our clinic review tool, if applicable. No additional management support is needed unless otherwise documented below in the visit note. 

## 2016-10-10 NOTE — Assessment & Plan Note (Signed)
Ongoing issue.  Only getting 4-5 hrs of symptom control w/ extended release medication.  She has been told previously that due to her gastric bypass she would have difficulty taking extended release meds.  Will switch to short acting adderall as she previously did better on this.  Will follow.

## 2016-10-10 NOTE — Progress Notes (Signed)
   Subjective:    Patient ID: Louis MatteMelissa Swiss, female    DOB: 11/15/1981, 35 y.o.   MRN: [id number]  HPI ADHD- pt reports that by 10 or 11 am there is no effect.  Pt reports medication will last 4-5 hrs at most.  Pt reports she was told that she is not able to take extended release medication due to her gastric bypass.  No palpitations, SOB, insomnia.  Pt reports that she is having to take Ativan later in the afternoon b/c she is so anxious about her uncontrolled ADHD that she has a hard time focusing on her work later in the day.   Review of Systems For ROS see HPI     Objective:   Physical Exam  Constitutional: She is oriented to person, place, and time. She appears well-developed and well-nourished. No distress.  obese  Cardiovascular: Normal rate, regular rhythm and normal heart sounds.   Pulmonary/Chest: Effort normal and breath sounds normal. No respiratory distress. She has no wheezes. She has no rales.  Neurological: She is alert and oriented to person, place, and time.  Skin: Skin is warm and dry.  Psychiatric: She has a normal mood and affect. Her behavior is normal. Thought content normal.  Vitals reviewed.         Assessment & Plan:

## 2016-10-10 NOTE — Patient Instructions (Signed)
Follow up in 4-6 weeks to recheck ADHD Switch to the short acting Adderall twice daily- taking a 3rd tablet later in the day if needed The goal is to try and limit the need for Ativan Call with any questions or concerns Hang in there!!!

## 2016-10-11 ENCOUNTER — Ambulatory Visit (HOSPITAL_BASED_OUTPATIENT_CLINIC_OR_DEPARTMENT_OTHER): Payer: BLUE CROSS/BLUE SHIELD

## 2016-10-11 VITALS — BP 129/65 | HR 66 | Temp 98.1°F | Resp 18

## 2016-10-11 DIAGNOSIS — Z9884 Bariatric surgery status: Secondary | ICD-10-CM

## 2016-10-11 DIAGNOSIS — D508 Other iron deficiency anemias: Secondary | ICD-10-CM | POA: Diagnosis not present

## 2016-10-11 DIAGNOSIS — K909 Intestinal malabsorption, unspecified: Secondary | ICD-10-CM | POA: Diagnosis not present

## 2016-10-11 MED ORDER — SODIUM CHLORIDE 0.9 % IV SOLN
Freq: Once | INTRAVENOUS | Status: AC
  Administered 2016-10-11: 12:00:00 via INTRAVENOUS

## 2016-10-11 MED ORDER — SODIUM CHLORIDE 0.9 % IV SOLN
510.0000 mg | Freq: Once | INTRAVENOUS | Status: AC
  Administered 2016-10-11: 510 mg via INTRAVENOUS
  Filled 2016-10-11: qty 17

## 2016-10-11 NOTE — Patient Instructions (Signed)

## 2016-10-25 ENCOUNTER — Other Ambulatory Visit (HOSPITAL_BASED_OUTPATIENT_CLINIC_OR_DEPARTMENT_OTHER): Payer: BLUE CROSS/BLUE SHIELD

## 2016-10-25 DIAGNOSIS — K909 Intestinal malabsorption, unspecified: Secondary | ICD-10-CM | POA: Diagnosis not present

## 2016-10-25 DIAGNOSIS — E559 Vitamin D deficiency, unspecified: Secondary | ICD-10-CM

## 2016-10-25 DIAGNOSIS — D510 Vitamin B12 deficiency anemia due to intrinsic factor deficiency: Secondary | ICD-10-CM

## 2016-10-25 DIAGNOSIS — D508 Other iron deficiency anemias: Secondary | ICD-10-CM | POA: Diagnosis not present

## 2016-10-25 DIAGNOSIS — Z9884 Bariatric surgery status: Secondary | ICD-10-CM

## 2016-10-25 LAB — IRON AND TIBC
%SAT: 36 % (ref 21–57)
IRON: 92 ug/dL (ref 41–142)
TIBC: 255 ug/dL (ref 236–444)
UIBC: 163 ug/dL (ref 120–384)

## 2016-10-25 LAB — CBC WITH DIFFERENTIAL (CANCER CENTER ONLY)
BASO#: 0 10*3/uL (ref 0.0–0.2)
BASO%: 0.2 % (ref 0.0–2.0)
EOS ABS: 0.1 10*3/uL (ref 0.0–0.5)
EOS%: 1.1 % (ref 0.0–7.0)
HEMATOCRIT: 35.2 % (ref 34.8–46.6)
HEMOGLOBIN: 11.2 g/dL — AB (ref 11.6–15.9)
LYMPH#: 1.5 10*3/uL (ref 0.9–3.3)
LYMPH%: 31.6 % (ref 14.0–48.0)
MCH: 27.6 pg (ref 26.0–34.0)
MCHC: 31.8 g/dL — ABNORMAL LOW (ref 32.0–36.0)
MCV: 87 fL (ref 81–101)
MONO#: 0.4 10*3/uL (ref 0.1–0.9)
MONO%: 8.2 % (ref 0.0–13.0)
NEUT%: 58.9 % (ref 39.6–80.0)
NEUTROS ABS: 2.8 10*3/uL (ref 1.5–6.5)
Platelets: 187 10*3/uL (ref 145–400)
RBC: 4.06 10*6/uL (ref 3.70–5.32)
RDW: 17.5 % — ABNORMAL HIGH (ref 11.1–15.7)
WBC: 4.8 10*3/uL (ref 3.9–10.0)

## 2016-10-25 LAB — FERRITIN: Ferritin: 116 ng/ml (ref 9–269)

## 2016-10-26 ENCOUNTER — Encounter: Payer: Self-pay | Admitting: Family

## 2016-10-26 ENCOUNTER — Other Ambulatory Visit: Payer: BLUE CROSS/BLUE SHIELD

## 2016-10-26 ENCOUNTER — Telehealth: Payer: Self-pay | Admitting: *Deleted

## 2016-10-26 LAB — VITAMIN B12: VITAMIN B 12: 271 pg/mL (ref 211–946)

## 2016-10-26 LAB — VITAMIN D 25 HYDROXY (VIT D DEFICIENCY, FRACTURES): VIT D 25 HYDROXY: 19.5 ng/mL — AB (ref 30.0–100.0)

## 2016-10-26 NOTE — Telephone Encounter (Signed)
-----   Message from Verdie MosherSarah M [place], NP sent at 10/26/2016 11:22 AM EDT ----- Regarding: Iron and vit D Iron studies look good. No infusion needed at this time. Vit D is still quite low. Is she taking her supplement as prescribed?? Thank you!  Sarah  ----- Message ----- From: Interface, Lab In Three Zero One Sent: 10/25/2016  11:01 AM To: Verdie MosherSarah M [place], NP

## 2016-10-28 ENCOUNTER — Other Ambulatory Visit: Payer: Self-pay | Admitting: *Deleted

## 2016-10-28 ENCOUNTER — Other Ambulatory Visit: Payer: Self-pay | Admitting: Family

## 2016-10-28 DIAGNOSIS — E559 Vitamin D deficiency, unspecified: Secondary | ICD-10-CM

## 2016-10-28 MED ORDER — VITAMIN D (ERGOCALCIFEROL) 1.25 MG (50000 UNIT) PO CAPS: 50000.0000 [IU] | ORAL_CAPSULE | ORAL | 0 refills | Status: DC

## 2016-10-28 NOTE — Progress Notes (Signed)
Patient taking Vit D as prescribed and level still low at 19. Supplement increased to 50,000 units twice a week. We will recheck her vit D at her next appointment.

## 2016-11-02 ENCOUNTER — Ambulatory Visit: Payer: BLUE CROSS/BLUE SHIELD | Admitting: Family

## 2016-11-02 ENCOUNTER — Ambulatory Visit: Payer: BLUE CROSS/BLUE SHIELD

## 2016-11-09 ENCOUNTER — Encounter: Payer: Self-pay | Admitting: Family

## 2016-12-06 ENCOUNTER — Other Ambulatory Visit: Payer: Self-pay | Admitting: Family Medicine

## 2016-12-06 NOTE — Telephone Encounter (Signed)
Last OV 10/10/16 Zolpidem last filled 09/26/16 #30 with 1

## 2016-12-22 ENCOUNTER — Ambulatory Visit (INDEPENDENT_AMBULATORY_CARE_PROVIDER_SITE_OTHER): Payer: BLUE CROSS/BLUE SHIELD | Admitting: Family Medicine

## 2016-12-22 ENCOUNTER — Encounter: Payer: Self-pay | Admitting: Family Medicine

## 2016-12-22 VITALS — BP 122/81 | HR 87 | Temp 98.6°F | Resp 17 | Ht 67.0 in | Wt 255.2 lb

## 2016-12-22 DIAGNOSIS — Z0000 Encounter for general adult medical examination without abnormal findings: Secondary | ICD-10-CM

## 2016-12-22 MED ORDER — AMPHETAMINE-DEXTROAMPHETAMINE 15 MG PO TABS: 15.0000 mg | ORAL_TABLET | Freq: Three times a day (TID) | ORAL | 0 refills | Status: DC

## 2016-12-22 MED ORDER — ZOLPIDEM TARTRATE 10 MG PO TABS: 10.0000 mg | ORAL_TABLET | Freq: Every day | ORAL | 1 refills | Status: DC

## 2016-12-22 MED ORDER — LORAZEPAM 0.5 MG PO TABS: 0.5000 mg | ORAL_TABLET | Freq: Three times a day (TID) | ORAL | 1 refills | Status: DC | PRN

## 2016-12-22 NOTE — Patient Instructions (Addendum)
Follow up in 6 months to recheck ADHD We'll notify you of your lab results and make any changes if needed Continue to work on healthy diet and regular exercise- you can do it!!! Call with any questions or concerns Happy New Year!!!

## 2016-12-22 NOTE — Progress Notes (Signed)
   Subjective:    Patient ID: Morgan MatteMelissa Joens, female    DOB: 12/29/1980, 35 y.o.   MRN: [id number]  HPI CPE- UTD on pap, too young for mammo.  UTD on Tdap.  Declines flu.  No concerns   Review of Systems Patient reports no vision/ hearing changes, adenopathy,fever, weight change,  persistant/recurrent hoarseness , swallowing issues, chest pain, palpitations, edema, persistant/recurrent cough, hemoptysis, dyspnea (rest/exertional/paroxysmal nocturnal), gastrointestinal bleeding (melena, rectal bleeding), abdominal pain, significant heartburn, bowel changes, GU symptoms (dysuria, hematuria, incontinence), Gyn symptoms (abnormal  bleeding, pain),  syncope, focal weakness, memory loss, numbness & tingling, skin/hair/nail changes, abnormal bruising or bleeding, anxiety, or depression.     Objective:   Physical Exam General Appearance:    Alert, cooperative, no distress, appears stated age, obese  Head:    Normocephalic, without obvious abnormality, atraumatic  Eyes:    PERRL, conjunctiva/corneas clear, EOM's intact, fundi    benign, both eyes  Ears:    Normal TM's and external ear canals, both ears  Nose:   Nares normal, septum midline, mucosa normal, no drainage    or sinus tenderness  Throat:   Lips, mucosa, and tongue normal; teeth and gums normal  Neck:   Supple, symmetrical, trachea midline, no adenopathy;    Thyroid: no enlargement/tenderness/nodules  Back:     Symmetric, no curvature, ROM normal, no CVA tenderness  Lungs:     Clear to auscultation bilaterally, respirations unlabored  Chest Wall:    No tenderness or deformity   Heart:    Regular rate and rhythm, S1 and S2 normal, no murmur, rub   or gallop  Breast Exam:    Deferred to GYN  Abdomen:     Soft, non-tender, bowel sounds active all four quadrants,    no masses, no organomegaly  Genitalia:    Deferred to GYN  Rectal:    Extremities:   Extremities normal, atraumatic, no cyanosis or edema  Pulses:   2+ and symmetric all  extremities  Skin:   Skin color, texture, turgor normal, no rashes or lesions  Lymph nodes:   Cervical, supraclavicular, and axillary nodes normal  Neurologic:   CNII-XII intact, normal strength, sensation and reflexes    throughout          Assessment & Plan:

## 2016-12-22 NOTE — Progress Notes (Signed)
Pre visit review using our clinic review tool, if applicable. No additional management support is needed unless otherwise documented below in the visit note. 

## 2016-12-22 NOTE — Assessment & Plan Note (Signed)
Pt's PE WNL w/ exception of obesity.  Reviewed labs done 3 months ago- WNL w/ exception of low Vit D (currently being repleted).  Stressed need for healthy diet and regular exercise.  UTD on pap, Tdap.  Declines flu.  Anticipatory guidance provided.

## 2017-01-19 ENCOUNTER — Other Ambulatory Visit: Payer: Self-pay | Admitting: Family Medicine

## 2017-01-19 MED ORDER — AMPHETAMINE-DEXTROAMPHETAMINE 15 MG PO TABS: 15.0000 mg | ORAL_TABLET | Freq: Three times a day (TID) | ORAL | 0 refills | Status: DC

## 2017-01-19 NOTE — Telephone Encounter (Signed)
Last OV 12/22/16 adderall last filled 12/22/16 #90 with 0

## 2017-02-19 IMAGING — CR DG LUMBAR SPINE COMPLETE 4+V
5 series · 5 of 5 positions shown · non-contrast
Comparison: None.

CLINICAL DATA: Tripped over chair at [place]. Lower
back pain, radiating through the left leg. Initial encounter.

EXAM:
LUMBAR SPINE - COMPLETE 4+ VIEW

[t lumbar spine ap]
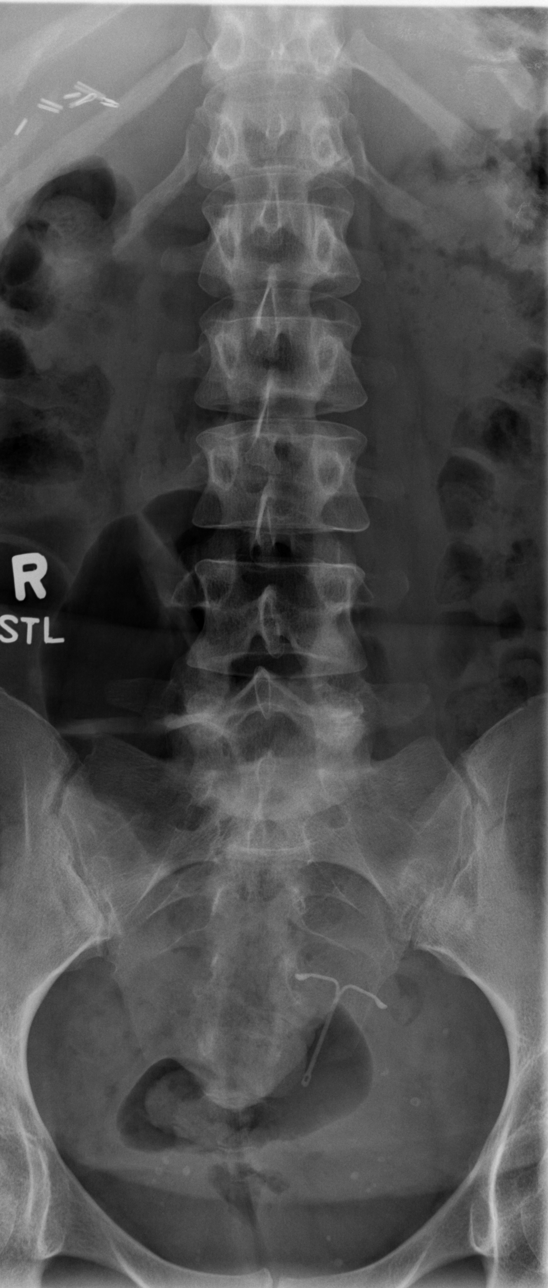

[t lumbar spine obl (1 of 2)]
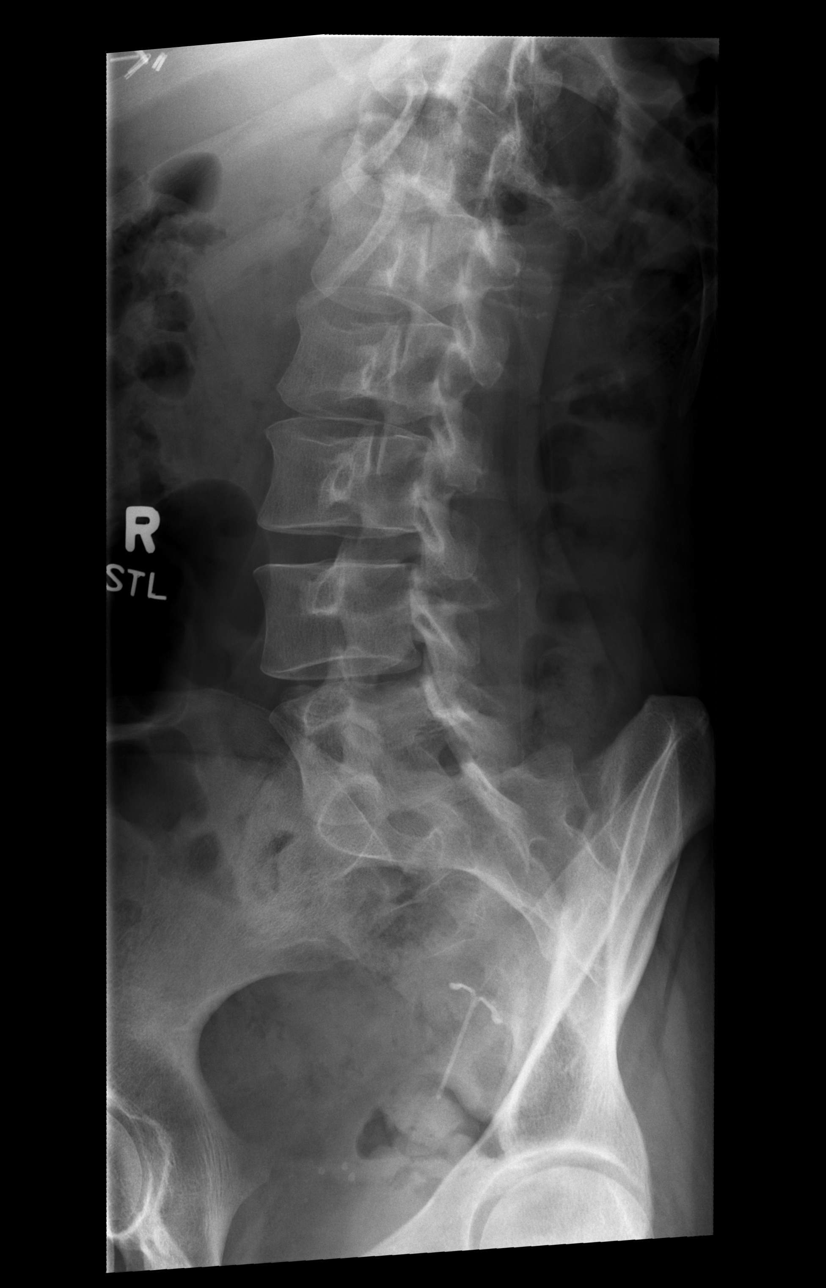

[t lumbar spine obl (2 of 2)]
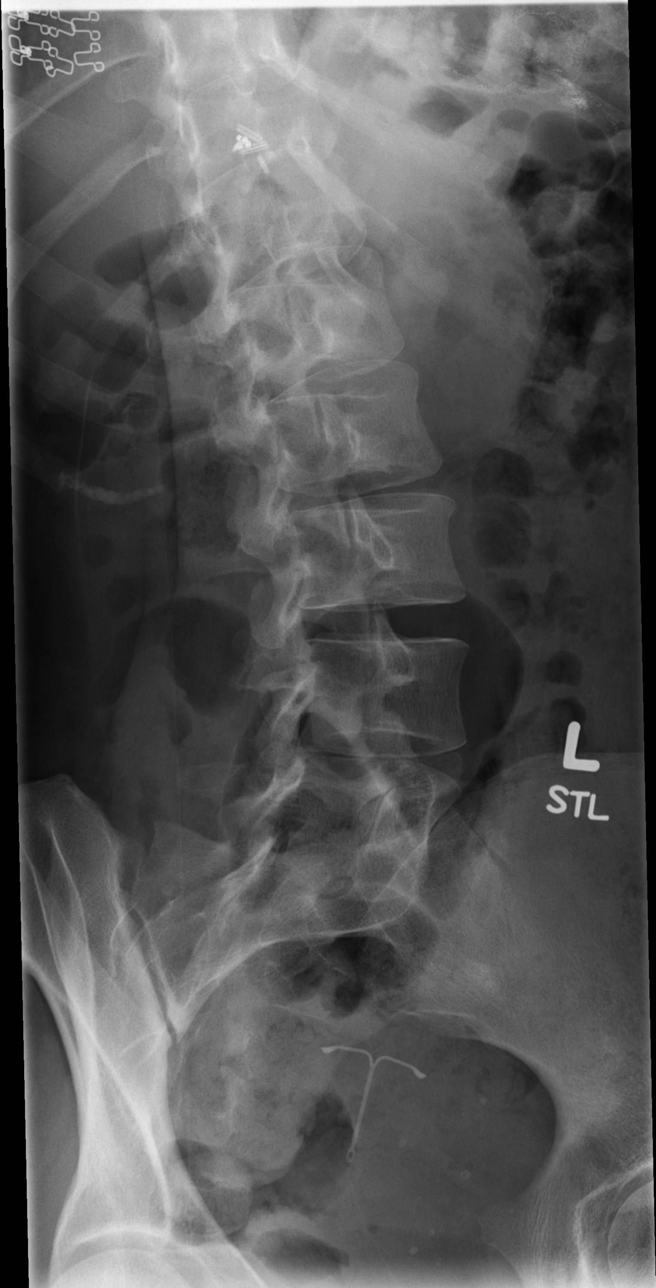

[t lumbar spine lat]
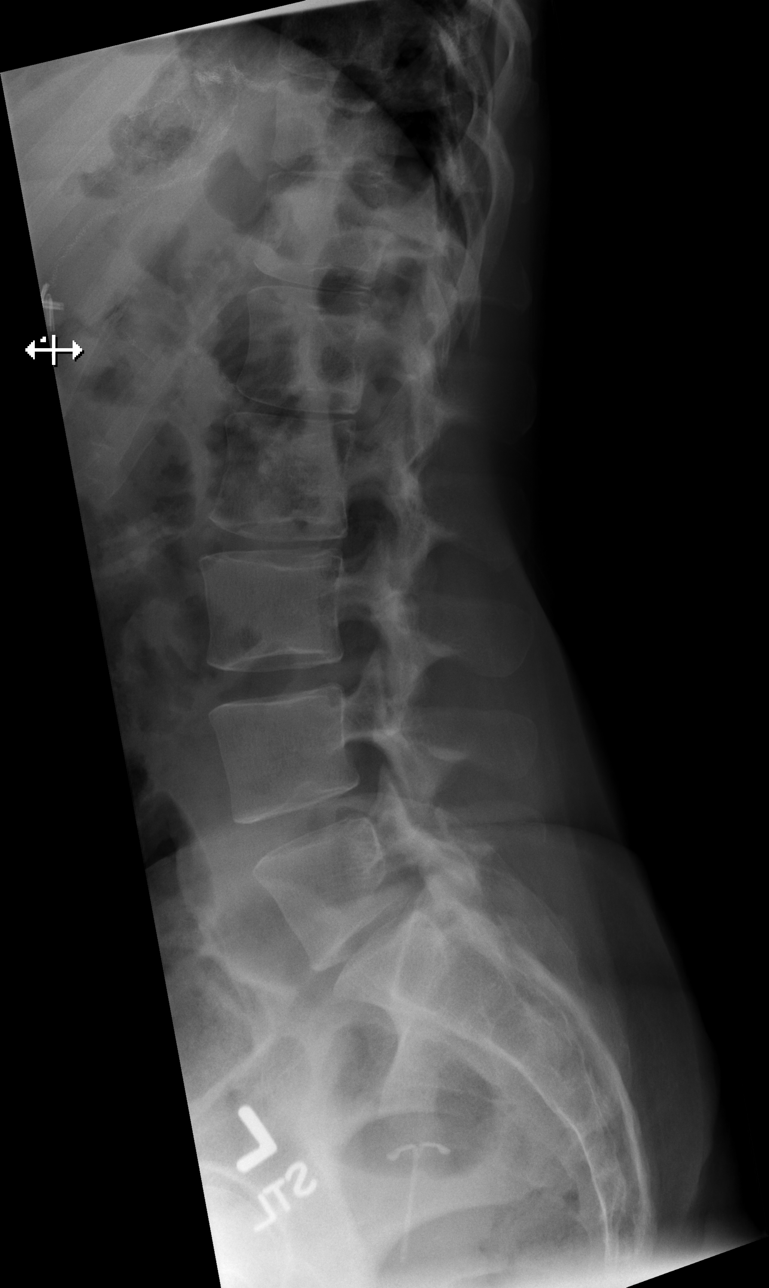

[t lumbar l-5 s-1 spot]
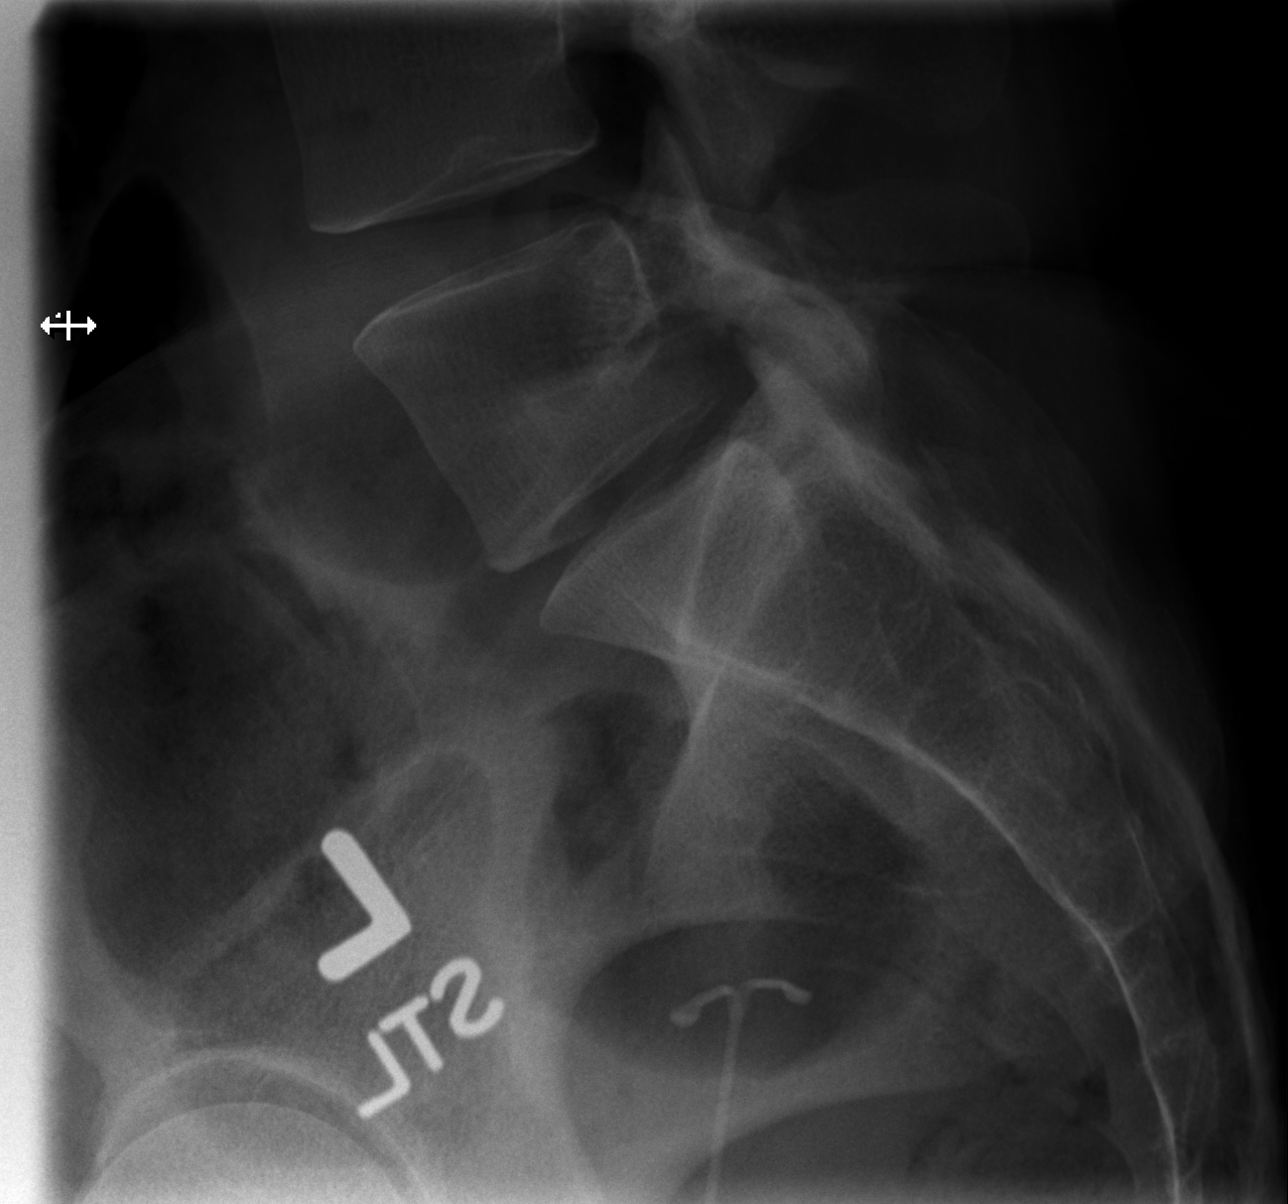

[5 of 5 positions shown; findings below may reference images not displayed]

FINDINGS: There is no evidence of fracture or subluxation. Vertebral bodies
demonstrate normal height and alignment. Intervertebral disc spaces
are preserved. The visualized neural foramina are grossly
unremarkable in appearance.

The visualized bowel gas pattern is unremarkable in appearance; air
and stool are noted within the colon. The sacroiliac joints are
within normal limits. Clips are noted within the right upper
quadrant, reflecting prior cholecystectomy. An intrauterine device
is noted overlying the mid pelvis.
IMPRESSION: No evidence of fracture or subluxation along the lumbar spine.

## 2017-02-26 ENCOUNTER — Encounter: Payer: Self-pay | Admitting: Family Medicine

## 2017-02-27 MED ORDER — ZOLPIDEM TARTRATE 10 MG PO TABS: 10.0000 mg | ORAL_TABLET | Freq: Every day | ORAL | 1 refills | Status: DC

## 2017-02-27 NOTE — Telephone Encounter (Signed)
Last OV 12/22/16 ambien last filled 12/22/16 #30 with 1

## 2017-03-31 ENCOUNTER — Encounter: Payer: Self-pay | Admitting: *Deleted

## 2017-03-31 ENCOUNTER — Emergency Department (INDEPENDENT_AMBULATORY_CARE_PROVIDER_SITE_OTHER)
Admission: EM | Admit: 2017-03-31 | Discharge: 2017-03-31 | Disposition: A | Discharge status: Home / Self Care | Payer: BLUE CROSS/BLUE SHIELD | Source: Home / Self Care | Attending: Family Medicine | Admitting: Family Medicine

## 2017-03-31 DIAGNOSIS — J069 Acute upper respiratory infection, unspecified: Secondary | ICD-10-CM | POA: Diagnosis not present

## 2017-03-31 MED ORDER — AZITHROMYCIN 250 MG PO TABS: 250.0000 mg | ORAL_TABLET | Freq: Every day | ORAL | 0 refills | Status: DC

## 2017-03-31 MED ORDER — ALBUTEROL SULFATE HFA 108 (90 BASE) MCG/ACT IN AERS: 1.0000 | INHALATION_SPRAY | Freq: Four times a day (QID) | RESPIRATORY_TRACT | 0 refills | Status: DC | PRN

## 2017-03-31 MED ORDER — PREDNISONE 20 MG PO TABS: ORAL_TABLET | ORAL | 0 refills | Status: DC
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
## 2017-04-17 ENCOUNTER — Other Ambulatory Visit: Payer: Self-pay | Admitting: Family Medicine

## 2017-04-17 ENCOUNTER — Other Ambulatory Visit: Payer: Self-pay | Admitting: Family

## 2017-04-17 DIAGNOSIS — E559 Vitamin D deficiency, unspecified: Secondary | ICD-10-CM

## 2017-04-18 ENCOUNTER — Other Ambulatory Visit: Payer: Self-pay | Admitting: *Deleted

## 2017-04-18 DIAGNOSIS — E559 Vitamin D deficiency, unspecified: Secondary | ICD-10-CM

## 2017-04-18 MED ORDER — VITAMIN D (ERGOCALCIFEROL) 1.25 MG (50000 UNIT) PO CAPS: 50000.0000 [IU] | ORAL_CAPSULE | ORAL | 3 refills | Status: DC

## 2017-04-18 MED ORDER — AMPHETAMINE-DEXTROAMPHETAMINE 15 MG PO TABS: 15.0000 mg | ORAL_TABLET | Freq: Three times a day (TID) | ORAL | 0 refills | Status: DC

## 2017-04-18 NOTE — Telephone Encounter (Signed)
Last OV 12/22/16 adderall last filled 01/19/17 #90 with 0

## 2017-04-18 NOTE — Telephone Encounter (Signed)
Reviewed controlled substance database- no red flags

## 2017-04-21 ENCOUNTER — Encounter: Payer: Self-pay | Admitting: Family Medicine

## 2017-04-21 MED ORDER — ZOLPIDEM TARTRATE 10 MG PO TABS: 10.0000 mg | ORAL_TABLET | Freq: Every day | ORAL | 1 refills | Status: DC

## 2017-04-21 NOTE — Telephone Encounter (Signed)
Last OV 12/22/16 ambien last filled 02/27/17 #30 with 1

## 2017-04-25 ENCOUNTER — Ambulatory Visit: Payer: BLUE CROSS/BLUE SHIELD | Admitting: Psychology

## 2017-04-28 ENCOUNTER — Ambulatory Visit: Payer: BLUE CROSS/BLUE SHIELD | Admitting: Psychology

## 2017-05-24 ENCOUNTER — Other Ambulatory Visit: Payer: Self-pay | Admitting: Family Medicine

## 2017-05-24 ENCOUNTER — Ambulatory Visit (INDEPENDENT_AMBULATORY_CARE_PROVIDER_SITE_OTHER): Payer: BLUE CROSS/BLUE SHIELD | Admitting: Psychology

## 2017-05-24 DIAGNOSIS — F330 Major depressive disorder, recurrent, mild: Secondary | ICD-10-CM

## 2017-05-24 MED ORDER — AMPHETAMINE-DEXTROAMPHETAMINE 15 MG PO TABS: 15.0000 mg | ORAL_TABLET | Freq: Three times a day (TID) | ORAL | 0 refills | Status: DC

## 2017-05-24 NOTE — Telephone Encounter (Signed)
Last OV 12/22/16 adderall last filled 04/18/17 #90 with 0

## 2017-06-14 ENCOUNTER — Ambulatory Visit (INDEPENDENT_AMBULATORY_CARE_PROVIDER_SITE_OTHER): Payer: BLUE CROSS/BLUE SHIELD | Admitting: Psychology

## 2017-06-14 DIAGNOSIS — F330 Major depressive disorder, recurrent, mild: Secondary | ICD-10-CM

## 2017-06-16 ENCOUNTER — Ambulatory Visit: Payer: BLUE CROSS/BLUE SHIELD | Admitting: Psychology

## 2017-06-20 ENCOUNTER — Other Ambulatory Visit: Payer: Self-pay | Admitting: Family Medicine

## 2017-06-20 MED ORDER — AMPHETAMINE-DEXTROAMPHETAMINE 15 MG PO TABS: 15.0000 mg | ORAL_TABLET | Freq: Three times a day (TID) | ORAL | 0 refills | Status: DC

## 2017-06-20 NOTE — Telephone Encounter (Signed)
Called pt to schedule appt. Pt stated that she could only do July 11th at 9:30. I told pt that time on that day was not avail and offered other times and dates. Pt declined stating that was the ONLY time she could do no other time or day would work. Pt states that she guesses that she will keep doing what she has done in the past for refills and see KT in Dec for CPE.

## 2017-06-20 NOTE — Telephone Encounter (Signed)
Last OV 12/22/16 adderall last filled 05/24/17  Please advise on the medication refill. I am sending the message to angela for scheduling.

## 2017-06-20 NOTE — Telephone Encounter (Signed)
Med filled and pt made aware on mychart.

## 2017-06-26 ENCOUNTER — Other Ambulatory Visit: Payer: Self-pay | Admitting: Family Medicine

## 2017-06-26 MED ORDER — ZOLPIDEM TARTRATE 10 MG PO TABS: 10.0000 mg | ORAL_TABLET | Freq: Every day | ORAL | 0 refills | Status: DC

## 2017-06-26 NOTE — Telephone Encounter (Signed)
Last OV 12/22/16 Zolpidem last filled 04/21/17 #30 with 1  CSC, Moderate risk

## 2017-06-26 NOTE — Telephone Encounter (Signed)
Med printed, ok per PCP and PA

## 2017-06-26 NOTE — Telephone Encounter (Signed)
Ok for #30 if Selena BattenCody will sign

## 2017-07-05 ENCOUNTER — Ambulatory Visit: Payer: BLUE CROSS/BLUE SHIELD | Admitting: Psychology

## 2017-07-12 ENCOUNTER — Ambulatory Visit (INDEPENDENT_AMBULATORY_CARE_PROVIDER_SITE_OTHER): Payer: BLUE CROSS/BLUE SHIELD | Admitting: Psychology

## 2017-07-12 DIAGNOSIS — F411 Generalized anxiety disorder: Secondary | ICD-10-CM | POA: Diagnosis not present

## 2017-07-14 ENCOUNTER — Other Ambulatory Visit: Payer: Self-pay | Admitting: General Practice

## 2017-07-14 NOTE — Telephone Encounter (Signed)
Last OV 12/22/16 Lorazepam last filled 12/22/16 #30 with 1

## 2017-07-17 MED ORDER — LORAZEPAM 0.5 MG PO TABS: 0.5000 mg | ORAL_TABLET | Freq: Three times a day (TID) | ORAL | 1 refills | Status: DC | PRN

## 2017-07-17 NOTE — Telephone Encounter (Signed)
Medication filled to pharmacy as requested.   

## 2017-07-20 ENCOUNTER — Encounter: Payer: Self-pay | Admitting: Family Medicine

## 2017-07-20 ENCOUNTER — Ambulatory Visit (INDEPENDENT_AMBULATORY_CARE_PROVIDER_SITE_OTHER): Payer: BLUE CROSS/BLUE SHIELD | Admitting: Family Medicine

## 2017-07-20 VITALS — BP 126/84 | HR 84 | Temp 97.9°F | Resp 16 | Ht 67.0 in | Wt 252.1 lb

## 2017-07-20 DIAGNOSIS — F900 Attention-deficit hyperactivity disorder, predominantly inattentive type: Secondary | ICD-10-CM

## 2017-07-20 DIAGNOSIS — F411 Generalized anxiety disorder: Secondary | ICD-10-CM | POA: Diagnosis not present

## 2017-07-20 MED ORDER — AMPHETAMINE-DEXTROAMPHETAMINE 15 MG PO TABS: 15.0000 mg | ORAL_TABLET | Freq: Three times a day (TID) | ORAL | 0 refills | Status: DC

## 2017-07-20 MED ORDER — VORTIOXETINE HBR 10 MG PO TABS: 1.0000 | ORAL_TABLET | Freq: Every day | ORAL | 3 refills | Status: DC

## 2017-07-20 MED ORDER — IBUPROFEN-FAMOTIDINE 800-26.6 MG PO TABS: 1.0000 | ORAL_TABLET | Freq: Three times a day (TID) | ORAL | 1 refills | Status: DC | PRN

## 2017-07-20 NOTE — Patient Instructions (Signed)
Follow up in 6 weeks to recheck anxiety Start the Trintellix once daily- this is the lower dose and there is room to increase if needed Continue the Adderall as directed Call with any questions or concerns Hang in there!  You can do this!

## 2017-07-20 NOTE — Progress Notes (Signed)
Pre visit review using our clinic review tool, if applicable. No additional management support is needed unless otherwise documented below in the visit note. 

## 2017-07-20 NOTE — Progress Notes (Signed)
   Subjective:    Patient ID: Morgan MatteMelissa Burgess, female    DOB: 07/26/1981, 36 y.o.   MRN: [id number]  HPI Anxiety- chronic problem but pt reports work has been 'so crazy lately' and she is having a difficult time w/ 'work/life balance'.  Pt has been on Wellbutrin previously w/o results.  Has also done Cymbalta and Celexa.  Pt isn't sure the Adderall is working   Review of Systems For ROS see HPI     Objective:   Physical Exam  Constitutional: She is oriented to person, place, and time. She appears well-developed and well-nourished. No distress.  obese  Neurological: She is alert and oriented to person, place, and time.  Skin: Skin is warm and dry.  Psychiatric: She has a normal mood and affect. Her behavior is normal. Thought content normal.  Vitals reviewed.         Assessment & Plan:

## 2017-07-21 ENCOUNTER — Encounter: Payer: Self-pay | Admitting: Family Medicine

## 2017-07-24 ENCOUNTER — Encounter: Payer: Self-pay | Admitting: Family Medicine

## 2017-07-24 MED ORDER — FLUOXETINE HCL 20 MG PO TABS: 20.0000 mg | ORAL_TABLET | Freq: Every day | ORAL | 3 refills | Status: DC

## 2017-07-25 ENCOUNTER — Other Ambulatory Visit: Payer: Self-pay | Admitting: Family Medicine

## 2017-07-25 ENCOUNTER — Other Ambulatory Visit: Payer: Self-pay | Admitting: Physician Assistant

## 2017-07-25 NOTE — Telephone Encounter (Signed)
Last OV 07/20/17 Zolpidem last filled 06/26/17 #30 with 0

## 2017-07-26 ENCOUNTER — Ambulatory Visit (INDEPENDENT_AMBULATORY_CARE_PROVIDER_SITE_OTHER): Payer: BLUE CROSS/BLUE SHIELD | Admitting: Psychology

## 2017-07-26 ENCOUNTER — Telehealth: Payer: Self-pay | Admitting: General Practice

## 2017-07-26 DIAGNOSIS — F330 Major depressive disorder, recurrent, mild: Secondary | ICD-10-CM | POA: Diagnosis not present

## 2017-07-26 NOTE — Assessment & Plan Note (Signed)
Deteriorated.  Pt reports she has been on Wellbutrin w/o results and Cymbalta and Celexa w/ side effects.  Admits that she is more anxious/depressed than usual.  Will start Trintellix due to better side effect profile.  Pt expressed understanding and is in agreement w/ plan.

## 2017-07-26 NOTE — Telephone Encounter (Signed)
Medication filled to pharmacy as requested.   

## 2017-07-26 NOTE — Telephone Encounter (Signed)
PA began today for duexis. Form completed and [facility]ed to [facility]

## 2017-07-26 NOTE — Assessment & Plan Note (Signed)
Chronic problem.  Pt isn't sure that Adderall is working but I do not want to increase the medication at this time b/c of her worsening anxiety.  Pt understands.  Will follow.

## 2017-07-26 NOTE — Telephone Encounter (Signed)
Last OV 07/20/17 ambien last filled 06/26/17 #30 with 0

## 2017-08-07 ENCOUNTER — Ambulatory Visit (INDEPENDENT_AMBULATORY_CARE_PROVIDER_SITE_OTHER): Payer: BLUE CROSS/BLUE SHIELD | Admitting: Psychology

## 2017-08-07 DIAGNOSIS — F330 Major depressive disorder, recurrent, mild: Secondary | ICD-10-CM | POA: Diagnosis not present

## 2017-09-03 ENCOUNTER — Encounter: Payer: Self-pay | Admitting: Family Medicine

## 2017-09-04 ENCOUNTER — Ambulatory Visit: Payer: Self-pay | Admitting: Family Medicine

## 2017-09-15 ENCOUNTER — Other Ambulatory Visit: Payer: Self-pay | Admitting: Family Medicine

## 2017-09-15 MED ORDER — AMPHETAMINE-DEXTROAMPHETAMINE 15 MG PO TABS: 15.0000 mg | ORAL_TABLET | Freq: Three times a day (TID) | ORAL | 0 refills | Status: DC

## 2017-09-15 NOTE — Telephone Encounter (Signed)
Last OV 07/20/17 adderall last filled 07/20/17 #90 with 0

## 2017-09-20 ENCOUNTER — Other Ambulatory Visit: Payer: Self-pay | Admitting: Family Medicine

## 2017-09-20 NOTE — Telephone Encounter (Signed)
Medication filled to pharmacy as requested.   

## 2017-09-20 NOTE — Telephone Encounter (Signed)
Last OV 07/20/17 Lorazepam last filled 07/17/17 #30 with 1

## 2017-10-04 ENCOUNTER — Encounter: Payer: Self-pay | Admitting: Family Medicine

## 2017-10-04 ENCOUNTER — Ambulatory Visit (INDEPENDENT_AMBULATORY_CARE_PROVIDER_SITE_OTHER): Payer: BLUE CROSS/BLUE SHIELD | Admitting: Family Medicine

## 2017-10-04 VITALS — BP 131/83 | HR 78 | Temp 97.9°F | Resp 16 | Ht 67.0 in | Wt 252.2 lb

## 2017-10-04 DIAGNOSIS — F5081 Binge eating disorder: Secondary | ICD-10-CM | POA: Diagnosis not present

## 2017-10-04 DIAGNOSIS — F900 Attention-deficit hyperactivity disorder, predominantly inattentive type: Secondary | ICD-10-CM

## 2017-10-04 DIAGNOSIS — F411 Generalized anxiety disorder: Secondary | ICD-10-CM

## 2017-10-04 MED ORDER — LISDEXAMFETAMINE DIMESYLATE 30 MG PO CAPS: 30.0000 mg | ORAL_CAPSULE | Freq: Every day | ORAL | 0 refills | Status: DC

## 2017-10-04 MED ORDER — AMPHETAMINE-DEXTROAMPHETAMINE 15 MG PO TABS: ORAL_TABLET | ORAL | 0 refills | Status: DC

## 2017-10-04 NOTE — Assessment & Plan Note (Signed)
Deteriorated.  Pt doesn't feel that her TID Adderall is as effective as previously.  She doesn't want to increase the dose if this is not the right medication for her.  Since she also has binge eating disorder, it makes sense to switch to Vyvanse as this has an indication for both binge eating and ADHD.  Will start  daily and continue an afternoon Adderall as needed for additional symptom control.  Pt expressed understanding and is in agreement w/ plan.

## 2017-10-04 NOTE — Assessment & Plan Note (Signed)
Improved since switching to Trintellix.  Pt feels sxs are well controlled and she is managing well at this time.  No dose change.

## 2017-10-04 NOTE — Progress Notes (Signed)
   Subjective:    Patient ID: Morgan Burgess, female    DOB: 07-22-1981, 36 y.o.   MRN: [id number]  HPI Anxiety/depression- chronic problem.  Currently on Trintellix  daily.  Pt feels that 'emotional side' is better but continues to struggle w/ focus and doesn't feel that the Adderall is as effective as it used to be.  Is interested in switching from Adderall to Vyvanse.  In the interim, has started seeing nutrition x6-7 weeks and it was discovered that she has binge eating issues.  Vyvanse has indication for binge eating.  Pt is also having issues w/ medication absorption due to hx of gastric bypass and finds that she needs to take meds more frequently as they wear off more quickly   Review of Systems For ROS see HPI     Objective:   Physical Exam  Constitutional: She is oriented to person, place, and time. She appears well-developed and well-nourished. No distress.  obese  Cardiovascular: Normal rate, regular rhythm and normal heart sounds.   Pulmonary/Chest: Effort normal and breath sounds normal. No respiratory distress. She has no wheezes. She has no rales.  Neurological: She is alert and oriented to person, place, and time.  Skin: Skin is warm and dry.  Psychiatric: She has a normal mood and affect. Her behavior is normal. Thought content normal.  Vitals reviewed.         Assessment & Plan:

## 2017-10-04 NOTE — Patient Instructions (Signed)
Follow up as scheduled Start the Vyvanse once daily Add the Adderall in the afternoon if needed for additional ADHD control Continue the Trintellix once daily Call with any questions or concerns Hang in there!!

## 2017-10-04 NOTE — Assessment & Plan Note (Signed)
New.  Pt has been seeing Nutrition in [place] x6-7 weeks and after doing a food journal, her binge eating tendencies were much more apparent.  Start Vyvanse to treat both her ADHD and binge eating.  Pt expressed understanding and is in agreement w/ plan.

## 2017-10-09 ENCOUNTER — Telehealth: Payer: Self-pay | Admitting: *Deleted

## 2017-10-09 NOTE — Telephone Encounter (Signed)
PA started for Vyvanse through cover my meds.  KEY: [id number]  Awaiting approval

## 2017-10-11 ENCOUNTER — Ambulatory Visit: Payer: Self-pay | Admitting: Family Medicine

## 2017-10-29 ENCOUNTER — Other Ambulatory Visit: Payer: Self-pay | Admitting: Family Medicine

## 2017-10-30 NOTE — Telephone Encounter (Signed)
Last OV 10/04/17 Lorazepam last filled 09/20/17 #30 with 0

## 2017-10-30 NOTE — Telephone Encounter (Signed)
Medication filled to pharmacy as requested.   

## 2017-11-18 ENCOUNTER — Other Ambulatory Visit: Payer: Self-pay | Admitting: Family Medicine

## 2017-11-20 NOTE — Telephone Encounter (Signed)
Last OV 10/04/17 Zolpidem last filled 07/26/17 #30 with 3

## 2017-11-20 NOTE — Telephone Encounter (Signed)
Medication filled to pharmacy as requested.   Ok for PEC to Discuss results / PCP recommendations / Schedule patient.   

## 2017-11-22 ENCOUNTER — Encounter: Payer: Self-pay | Admitting: Family Medicine

## 2017-11-23 ENCOUNTER — Encounter: Payer: Self-pay | Admitting: Family Medicine

## 2017-11-24 ENCOUNTER — Encounter: Payer: Self-pay | Admitting: General Practice

## 2017-11-28 ENCOUNTER — Encounter: Payer: Self-pay | Admitting: Family Medicine

## 2017-11-28 MED ORDER — LISDEXAMFETAMINE DIMESYLATE 60 MG PO CAPS: 60.0000 mg | ORAL_CAPSULE | ORAL | 0 refills | Status: DC

## 2017-12-01 ENCOUNTER — Telehealth: Payer: Self-pay | Admitting: Family Medicine

## 2017-12-01 NOTE — Telephone Encounter (Signed)
Lab orders were refaxed last week and a copy is already with prescription at the front desk.

## 2017-12-01 NOTE — Telephone Encounter (Signed)
Spoke to Pt about lab orders. Pt states that she requested the order be faxed last week, never received it. States she spoke to [facility] on Wednesday, still has not received the orders.  Wants orders given to husband Joyice Faster(Doug Bernabe) while he is here for his physical.

## 2017-12-14 LAB — LIPID PANEL
Cholesterol: 146 (ref 0–200)
HDL: 75 — AB (ref 35–70)
Triglycerides: 48 (ref 40–160)

## 2017-12-14 LAB — HEPATIC FUNCTION PANEL
ALK PHOS: 92 (ref 25–125)
ALT: 9 (ref 7–35)
AST: 14 (ref 13–35)
BILIRUBIN, TOTAL: 0.5

## 2017-12-14 LAB — CBC AND DIFFERENTIAL
HCT: 37 (ref 36–46)
PLATELETS: 215 (ref 150–399)
WBC: 4.5

## 2017-12-14 LAB — BASIC METABOLIC PANEL
BUN: 8 (ref 4–21)
Creatinine: 0.8 (ref 0.5–1.1)
GLUCOSE: 87
POTASSIUM: 5 (ref 3.4–5.3)
Sodium: 142 (ref 137–147)

## 2017-12-14 LAB — VITAMIN D 25 HYDROXY (VIT D DEFICIENCY, FRACTURES): VIT D 25 HYDROXY: 45.2

## 2017-12-14 LAB — VITAMIN B12: Vitamin B-12: 285

## 2017-12-15 ENCOUNTER — Other Ambulatory Visit: Payer: Self-pay | Admitting: Family Medicine

## 2017-12-16 ENCOUNTER — Other Ambulatory Visit: Payer: Self-pay | Admitting: Family Medicine

## 2017-12-18 ENCOUNTER — Encounter: Payer: Self-pay | Admitting: Family Medicine

## 2017-12-18 NOTE — Telephone Encounter (Signed)
Medication filled to pharmacy as requested.   

## 2017-12-18 NOTE — Telephone Encounter (Signed)
Last OV 10/04/17 ambien last filled 11/20/17 #30 with 0

## 2017-12-19 ENCOUNTER — Other Ambulatory Visit: Payer: Self-pay | Admitting: Family Medicine

## 2017-12-22 ENCOUNTER — Encounter: Payer: Self-pay | Admitting: Family Medicine

## 2017-12-22 ENCOUNTER — Ambulatory Visit (INDEPENDENT_AMBULATORY_CARE_PROVIDER_SITE_OTHER): Payer: BLUE CROSS/BLUE SHIELD | Admitting: Family Medicine

## 2017-12-22 ENCOUNTER — Telehealth: Payer: Self-pay | Admitting: Family Medicine

## 2017-12-22 VITALS — BP 128/60 | HR 92 | Temp 98.7°F | Ht 65.25 in | Wt 255.0 lb

## 2017-12-22 DIAGNOSIS — Z0000 Encounter for general adult medical examination without abnormal findings: Secondary | ICD-10-CM

## 2017-12-22 MED ORDER — AMPHETAMINE-DEXTROAMPHETAMINE 15 MG PO TABS: ORAL_TABLET | ORAL | 0 refills | Status: DC
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
## 2017-12-22 NOTE — Telephone Encounter (Signed)
Adderall prescription sent to pharmacy- 15mg  TID, #90

## 2017-12-22 NOTE — Progress Notes (Addendum)
Subjective:  Patient ID: Morgan Burgess Liggett, female    DOB: 03/15/1981  Age: 36 y.o. MRN: [id number]  CC: Annual Exam (Patient is here today for a CPE. She brought in her completed labs with her today.  ) and ADHD (Patient also has had a problem getting the Vyvanse so she has brought the Rx's and hoping to exchange them in order to get the previous dosing of Adderall 15mg  tid.)   HPI Morgan Burgess Devol presents for a physical exam as required by her [employer] Corporationhusband's insurance company.  She she brings in lab work to include: CMP, CBC, lipid profile, thyroid profile, iron profile, vitamin D, and vitamin B12 and folate.  All of the above lab work is normal.  Her HDL level is higher than her LDL level.  Her hemoglobin, vitamin D and iron levels are all normal.  Currently hematology is managing these.  Her parents are 5565 and 37 years old and is in excellent health she says.  She remains up-to-date on Pap and pelvic exams.  She does not smoke drink alcohol or use illicit drugs.  She lives with her husband and 3 daughters ages 5015, 3611, and 173.  She works for a [employer].  She is status post gastric bypass surgery 14 years ago and lost 125 pounds.  Since that time she has struggled severe iron deficiency anemia requiring iron infusions.  Her vitamin D levels have been chronically low.  She is taking 50,000 units of vitamin D 4 times a week.  She also has ADHD and generalized anxiety disorder.  She is planning on seeing Dr. Dayton MartesAron in the near future for these issues and her follow-up care.    History Carolena has a past medical history of Depression.   She has a past surgical history that includes Cholecystectomy (2004); Gastric bypass (2004); and Dilation and curettage of uterus (2012).   Her family history includes Hypertension in her father and mother.She reports that  has never smoked. she has never used smokeless tobacco. She reports that she does not drink alcohol or use drugs.  Outpatient Medications Prior to Visit    Medication Sig Dispense Refill  . amphetamine-dextroamphetamine (ADDERALL) 15 MG tablet Take 1 tab in the afternoon as needed for extra ADHD control (Patient taking differently: Take 1 tablet by mouth 3 (three) times daily. Take 1 tab in the afternoon as needed for extra ADHD control) 30 tablet 0  . DUEXIS 800-26.6 MG TABS TAKE 1 TABLET BY MOUTH THREE TIMES DAILY 90 tablet 0  . FLECTOR 1.3 % PTCH   3  . levocetirizine (XYZAL) 5 MG tablet Take 5 mg by mouth every evening.    Marland Kitchen. levonorgestrel (MIRENA) 20 MCG/24HR IUD 1 each by Intrauterine route continuous.    Marland Kitchen. LORazepam (ATIVAN) 0.5 MG tablet TAKE 1 TABLET BY MOUTH EVERY 8 HOURS AS NEEDED 30 tablet 0  . SPRIX 15.75 MG/SPRAY SOLN     . Vitamin D, Ergocalciferol, (DRISDOL) 50000 units CAPS capsule TK 1 C PO 2 TIMES A WK  3  . vortioxetine HBr (TRINTELLIX) 10 MG TABS Take 1 tablet (10 mg total) by mouth daily. 30 tablet 3  . zolpidem (AMBIEN) 10 MG tablet TAKE 1 TABLET BY MOUTH EVERY NIGHT AT BEDTIME AS NEEDED 30 tablet 3  . amphetamine-dextroamphetamine (ADDERALL) 15 MG tablet Take 1 tab PO in the afternoon as needed for additional ADHD control.  Fill 30 days after RX signed 30 tablet 0  . lisdexamfetamine (VYVANSE) 60 MG capsule Take 1  capsule (60 mg total) by mouth every morning. 30 capsule 0  . VYVANSE 30 MG capsule      No facility-administered medications prior to visit.     ROS Review of Systems  Constitutional: Negative.   HENT: Negative.   Eyes: Negative.   Respiratory: Negative.   Cardiovascular: Negative.   Gastrointestinal: Negative.   Endocrine: Negative for cold intolerance, heat intolerance, polyphagia and polyuria.  Genitourinary: Negative.   Musculoskeletal: Negative.   Skin: Negative.   Neurological: Negative.   Hematological: Negative.   Psychiatric/Behavioral: The patient is nervous/anxious.     Objective:  BP 128/60 (BP Location: Right Arm, Patient Position: Sitting, Cuff Size: Normal)   Pulse 92   Temp 98.7  F (37.1 C) (Oral)   Ht 5' 5.25" (1.657 m)   Wt 255 lb (115.7 kg)   SpO2 99%   BMI 42.11 kg/m   Physical Exam  Constitutional: She is oriented to person, place, and time. She appears well-developed and well-nourished. No distress.  HENT:  Head: Normocephalic and atraumatic.  Right Ear: External ear normal.  Left Ear: External ear normal.  Mouth/Throat: Oropharynx is clear and moist. No oropharyngeal exudate.  Eyes: Conjunctivae are normal. Pupils are equal, round, and reactive to light. Right eye exhibits no discharge. Left eye exhibits no discharge. No scleral icterus.  Neck: Neck supple. No JVD present. No tracheal deviation present. No thyromegaly present.  Cardiovascular: Normal rate, regular rhythm and normal heart sounds.  Pulmonary/Chest: Effort normal and breath sounds normal. No stridor.  Abdominal: Bowel sounds are normal.  Lymphadenopathy:    She has no cervical adenopathy.  Neurological: She is alert and oriented to person, place, and time.  Skin: Skin is warm and dry. She is not diaphoretic.  Psychiatric: She has a normal mood and affect. Her behavior is normal.      Assessment & Plan:   Efraim KaufmannMelissa was seen today for annual exam and adhd.  Diagnoses and all orders for this visit:  Routine general medical examination at a health care facility   I have discontinued Daron Aversa's lisdexamfetamine and VYVANSE. I am also having her maintain her levonorgestrel, levocetirizine, vortioxetine HBr, Vitamin D (Ergocalciferol), FLECTOR, SPRIX, amphetamine-dextroamphetamine, LORazepam, zolpidem, and DUEXIS.  No orders of the defined types were placed in this encounter.    Follow-up: Return if symptoms worsen or fail to improve.  Mliss SaxWilliam Alfred Kremer, MD

## 2017-12-22 NOTE — Telephone Encounter (Signed)
Last OV: 10/04/17 *Will be seen for CPE today with another provider since PCP is not available.   Last Refill: 10/04/17, 30 with 0 refill

## 2017-12-22 NOTE — Telephone Encounter (Signed)
Requesting refill for Adderall

## 2018-01-02 ENCOUNTER — Encounter: Payer: Self-pay | Admitting: Family Medicine

## 2018-01-03 ENCOUNTER — Ambulatory Visit: Payer: BLUE CROSS/BLUE SHIELD | Admitting: Family Medicine

## 2018-01-05 ENCOUNTER — Telehealth: Payer: Self-pay | Admitting: General Practice

## 2018-01-05 NOTE — Telephone Encounter (Signed)
Last OV 12/22/17 CPE with [facility] Lorazepam last filled 10/30/17 #30 with 0  Is she still ours?

## 2018-01-08 MED ORDER — LORAZEPAM 0.5 MG PO TABS: 0.5000 mg | ORAL_TABLET | Freq: Three times a day (TID) | ORAL | 0 refills | Status: DC | PRN

## 2018-01-08 NOTE — Telephone Encounter (Signed)
Ok for #30 of Lorazepam, no refills

## 2018-01-08 NOTE — Addendum Note (Signed)
Addended by: Geannie RisenBRODMERKEL, JESSICA L on: 01/08/2018 03:09 PM   Modules accepted: Orders

## 2018-01-08 NOTE — Telephone Encounter (Signed)
Medication filled to pharmacy as requested.   

## 2018-01-08 NOTE — Telephone Encounter (Signed)
She has an upcoming appt w/ Dr Dayton MartesAron so it appears that she has transferred.

## 2018-01-08 NOTE — Telephone Encounter (Signed)
She is transferring care to Dr. Nolon BussingAron/that is incomplete as of right now and she will need to get Rx's through current PCP until the date she is seen by Dr. Dayton MartesAron that is scheduled/thx dmf

## 2018-01-08 NOTE — Telephone Encounter (Signed)
Patient was only seen for a CPE-[facility] to [facility] our [facility] of with her PCP, she isn't scheduled to transfer to Dr. Dayton MartesAron until 1/31.

## 2018-01-08 NOTE — Telephone Encounter (Signed)
To See Dr. Dayton MartesAron.

## 2018-01-25 ENCOUNTER — Ambulatory Visit: Payer: BLUE CROSS/BLUE SHIELD | Admitting: Family Medicine

## 2018-01-25 ENCOUNTER — Encounter: Payer: Self-pay | Admitting: Family Medicine

## 2018-01-25 VITALS — BP 122/84 | HR 78 | Temp 97.7°F | Ht 66.0 in | Wt 259.6 lb

## 2018-01-25 DIAGNOSIS — F900 Attention-deficit hyperactivity disorder, predominantly inattentive type: Secondary | ICD-10-CM

## 2018-01-25 MED ORDER — AMPHETAMINE-DEXTROAMPHETAMINE 15 MG PO TABS: ORAL_TABLET | ORAL | 0 refills | Status: DC

## 2018-01-25 NOTE — Assessment & Plan Note (Signed)
>  25 minutes spent in face to face time with patient, >50% spent in counselling or coordination of care Well controlled. Attempted to review [place] controlled substance database but website is not functioning today. Chart reviewed- no red flags. Rx printed and given to pt. Follow up in 3 months.

## 2018-01-25 NOTE — Progress Notes (Signed)
Subjective:   Patient ID: Morgan Burgess, female    DOB: 1981/05/02, 37 y.o.   MRN: [id number]  Zelia Yzaguirre is a pleasant 37 y.o. year old female who presents to clinic today with New Patient (Initial Visit) (Patient is here today to transfer care from Dr. Beverely Low to Dr. Dayton Martes.) and ADHD (Patient is here today for ADHD. She currently takes Adderall 15mg  tid which works well for her.)  on 01/25/2018  HPI:  ADHD- diagnosed by formal evaluation years ago. Currently taking Adderrall 15 mg three times daily which has been working well for her.    Current Outpatient Medications on File Prior to Visit  Medication Sig Dispense Refill  . DUEXIS 800-26.6 MG TABS TAKE 1 TABLET BY MOUTH THREE TIMES DAILY 90 tablet 0  . levocetirizine (XYZAL) 5 MG tablet Take 5 mg by mouth every evening.    Marland Kitchen levonorgestrel (MIRENA) 20 MCG/24HR IUD 1 each by Intrauterine route continuous.    Marland Kitchen LORazepam (ATIVAN) 0.5 MG tablet Take 1 tablet (0.5 mg total) by mouth every 8 (eight) hours as needed. 30 tablet 0  . Vitamin D, Ergocalciferol, (DRISDOL) 50000 units CAPS capsule TK 1 C PO 2 TIMES A WK  3  . vortioxetine HBr (TRINTELLIX) 10 MG TABS Take 1 tablet (10 mg total) by mouth daily. 30 tablet 3  . zolpidem (AMBIEN) 10 MG tablet TAKE 1 TABLET BY MOUTH EVERY NIGHT AT BEDTIME AS NEEDED 30 tablet 3   No current facility-administered medications on file prior to visit.     Allergies  Allergen Reactions  . Latex Hives    Past Medical History:  Diagnosis Date  . Depression     Past Surgical History:  Procedure Laterality Date  . CHOLECYSTECTOMY  2004  . DILATION AND CURETTAGE OF UTERUS  2012  . GASTRIC BYPASS  2004    Family History  Problem Relation Age of Onset  . Hypertension Mother   . Hypertension Father   . Diabetes Neg Hx   . Heart attack Neg Hx     Social History   Socioeconomic History  . Marital status: Married    Spouse name: Not on file  . Number of children: Not on file  . Years  of education: Not on file  . Highest education level: Not on file  Social Needs  . Financial resource strain: Not on file  . Food insecurity - worry: Not on file  . Food insecurity - inability: Not on file  . Transportation needs - medical: Not on file  . Transportation needs - non-medical: Not on file  Occupational History  . Not on file  Tobacco Use  . Smoking status: Never Smoker  . Smokeless tobacco: Never Used  Substance and Sexual Activity  . Alcohol use: No  . Drug use: No  . Sexual activity: Yes  Other Topics Concern  . Not on file  Social History Narrative  . Not on file   The PMH, PSH, Social History, Family History, Medications, and allergies have been reviewed in [facility], and have been updated if relevant.   Review of Systems  Respiratory: Negative.   Cardiovascular: Negative.   Psychiatric/Behavioral: Negative.   All other systems reviewed and are negative.      Objective:    BP 122/84 (BP Location: Left Arm, Patient Position: Sitting, Cuff Size: Normal)   Pulse 78   Temp 97.7 F (36.5 C) (Oral)   Ht 5\' 6"  (1.676 m)   Wt 259 lb  9.6 oz (117.8 kg)   SpO2 99%   BMI 41.90 kg/m    Physical Exam  Constitutional: She is oriented to person, place, and time. She appears well-developed and well-nourished. No distress.  HENT:  Head: Normocephalic and atraumatic.  Eyes: Conjunctivae are normal.  Cardiovascular: Normal rate.  Pulmonary/Chest: Effort normal.  Musculoskeletal: Normal range of motion.  Neurological: She is alert and oriented to person, place, and time. No cranial nerve deficit.  Skin: Skin is warm and dry. She is not diaphoretic.  Psychiatric: She has a normal mood and affect. Her behavior is normal. Judgment and thought content normal.  Nursing note and vitals reviewed.         Assessment & Plan:   Attention deficit hyperactivity disorder (ADHD), predominantly inattentive type No Follow-up on file.

## 2018-01-25 NOTE — Patient Instructions (Signed)
Very nice to meet you. Please come see me in 3 months.

## 2018-02-01 ENCOUNTER — Encounter: Payer: Self-pay | Admitting: General Practice

## 2018-02-01 ENCOUNTER — Telehealth: Payer: Self-pay | Admitting: Family Medicine

## 2018-02-01 ENCOUNTER — Encounter: Payer: Self-pay | Admitting: Family Medicine

## 2018-02-01 NOTE — Telephone Encounter (Signed)
Please explain to pt that most insurance companies are NOT covering labs as part of a routine physical which is why we had to associate this w/ a medical dx.  I am happy to send a letter that has the Z00.0 code for CPE but this may not be covered either.

## 2018-02-01 NOTE — Telephone Encounter (Signed)
[name] fax number provided. I provided her with the diagnosis and diagnosis code. No labs have been drawn by [facility] since 2017.

## 2018-02-01 NOTE — Telephone Encounter (Signed)
Letter was printed could you find out if there is a fax pt would like it sent to?

## 2018-02-01 NOTE — Telephone Encounter (Signed)
Letter faxed to [phone] Attn: Efraim KaufmannMelissa

## 2018-02-01 NOTE — Telephone Encounter (Signed)
Shanda BumpsJessica,  Patient is needing a new copy of the lab order sheet that was faxed to her for her labs in December. The z codes are missing and it isn't being coded like it's for a physical. Patient needs these re-done and re-faxed to her so that she can resubmit it to the insurance company so that they don't charge her the $600. The fax number & patient's phone number are listed below.

## 2018-02-01 NOTE — Telephone Encounter (Signed)
Morgan Burgess said she needs the Zcode for the labs. The labs were done at [facility].  She said she did the labs on 12/20. She wants you to please call her. [phone]. She is going to fax back her lab results that should be upload in her chart.

## 2018-02-01 NOTE — Telephone Encounter (Signed)
Copied from CRM #[id number]. Topic: Quick Communication - See Telephone Encounter >> Feb 01, 2018  8:30 AM Jolayne Hainesaylor, Brittany L wrote: CRM for notification. See Telephone encounter for:   02/01/18.  Patient said she had a CPE with Dr Doreene BurkeKremer on 12/28. She said she needs the Diagnosis code for this & and original order sheet that she had for the CPE/labs. She said that her insurance is charging $600 because it was not coded right. Call back [phone], Fax [phone] Attention: Cory MunchMelissa Tried to connect her to billing & she said she has already contacted them.

## 2018-02-01 NOTE — Telephone Encounter (Signed)
Please advise, we typed a letter for pt with the labs. We do not have the original sheet.

## 2018-02-10 ENCOUNTER — Encounter: Payer: Self-pay | Admitting: Family Medicine

## 2018-02-12 ENCOUNTER — Other Ambulatory Visit: Payer: Self-pay

## 2018-02-12 MED ORDER — VORTIOXETINE HBR 20 MG PO TABS: 20.0000 mg | ORAL_TABLET | Freq: Every day | ORAL | 2 refills | Status: DC

## 2018-03-23 ENCOUNTER — Other Ambulatory Visit: Payer: Self-pay | Admitting: Family Medicine

## 2018-04-07 ENCOUNTER — Other Ambulatory Visit: Payer: Self-pay | Admitting: Family Medicine

## 2018-04-15 ENCOUNTER — Other Ambulatory Visit: Payer: Self-pay | Admitting: Hematology & Oncology

## 2018-04-15 DIAGNOSIS — E559 Vitamin D deficiency, unspecified: Secondary | ICD-10-CM

## 2018-04-23 IMAGING — DX DG CHEST 2V
2 series · 2 of 2 positions shown · non-contrast
Comparison: None.

CLINICAL DATA: Pt states that since [REDACTED] she has had sob with a
productive cough and fever.

EXAM:
CHEST  2 VIEW

[chest pa]
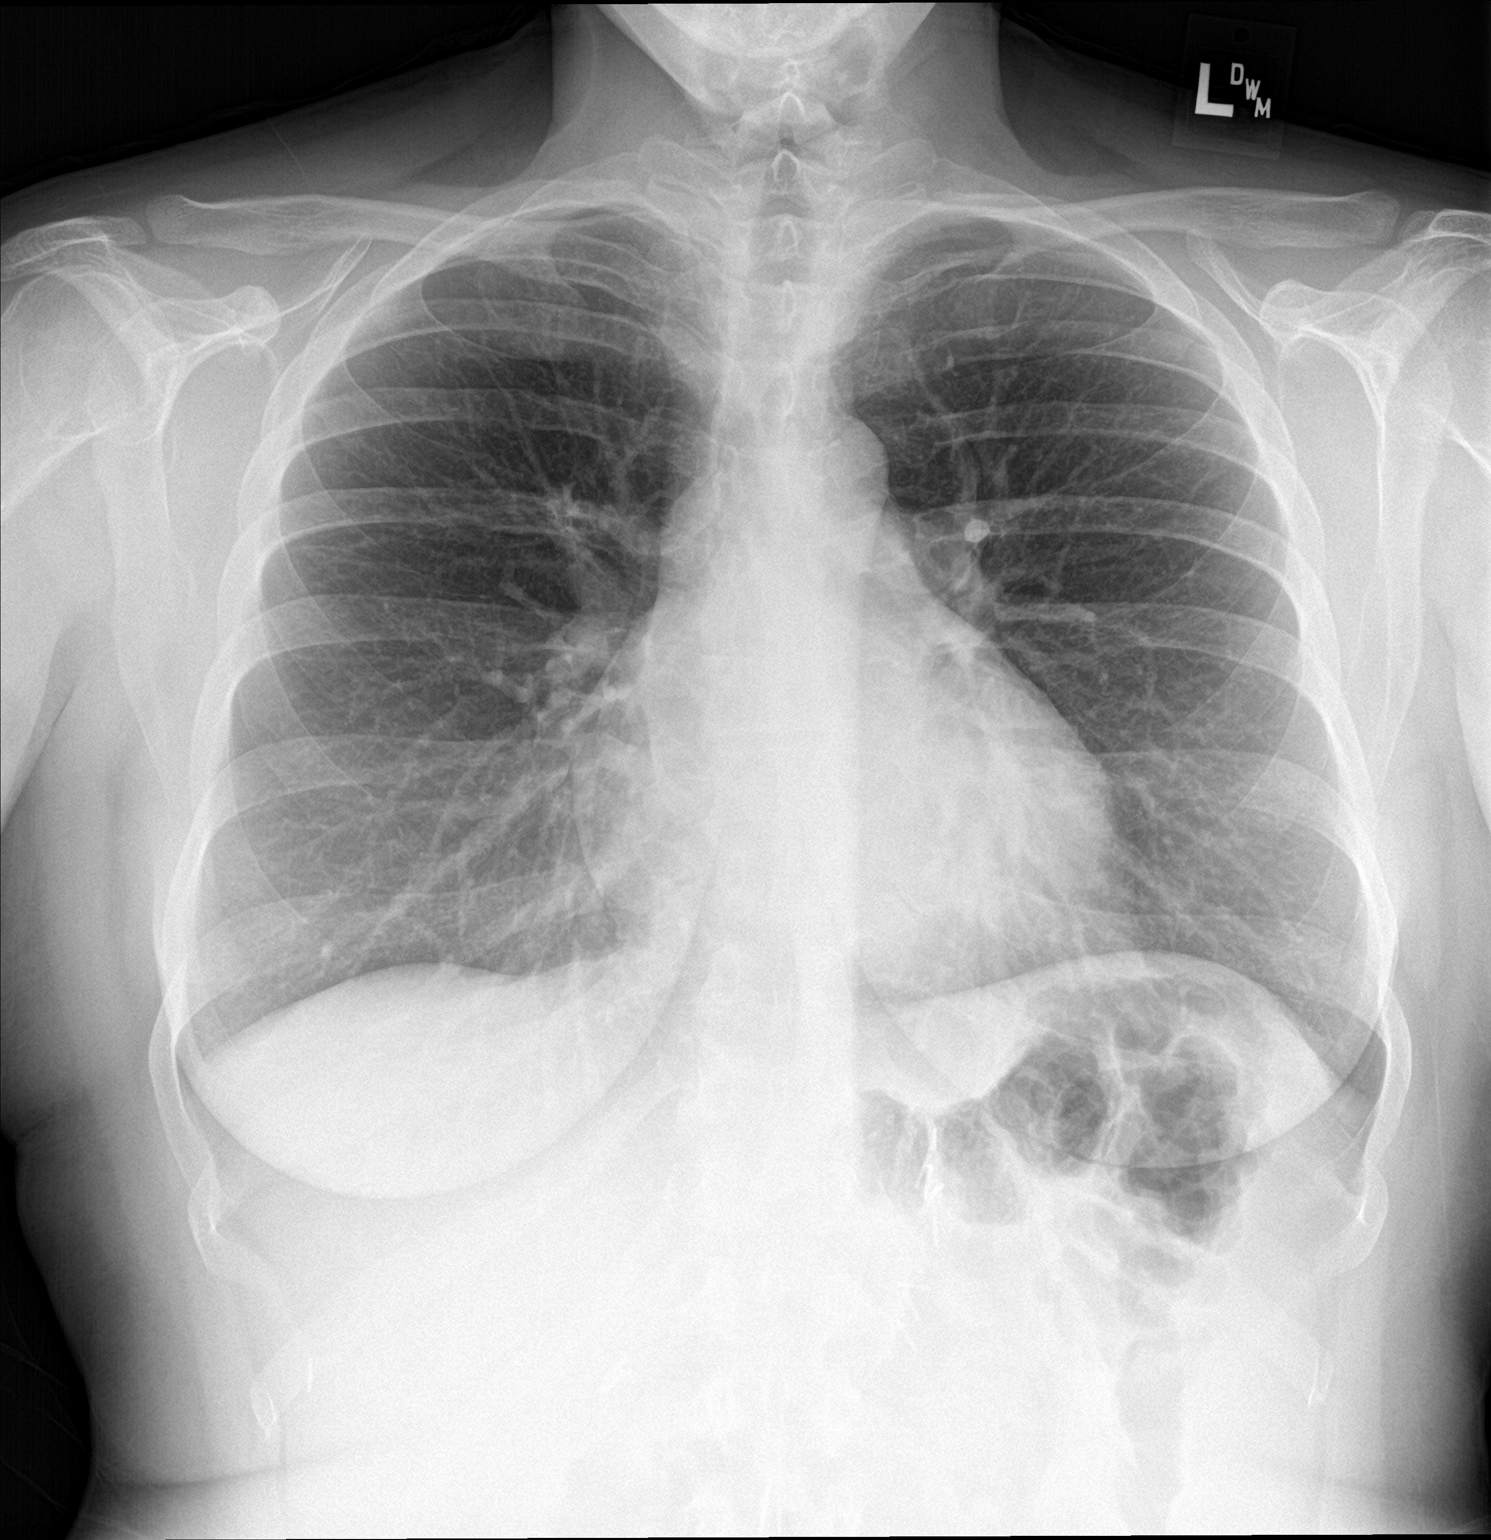

[chest lat]
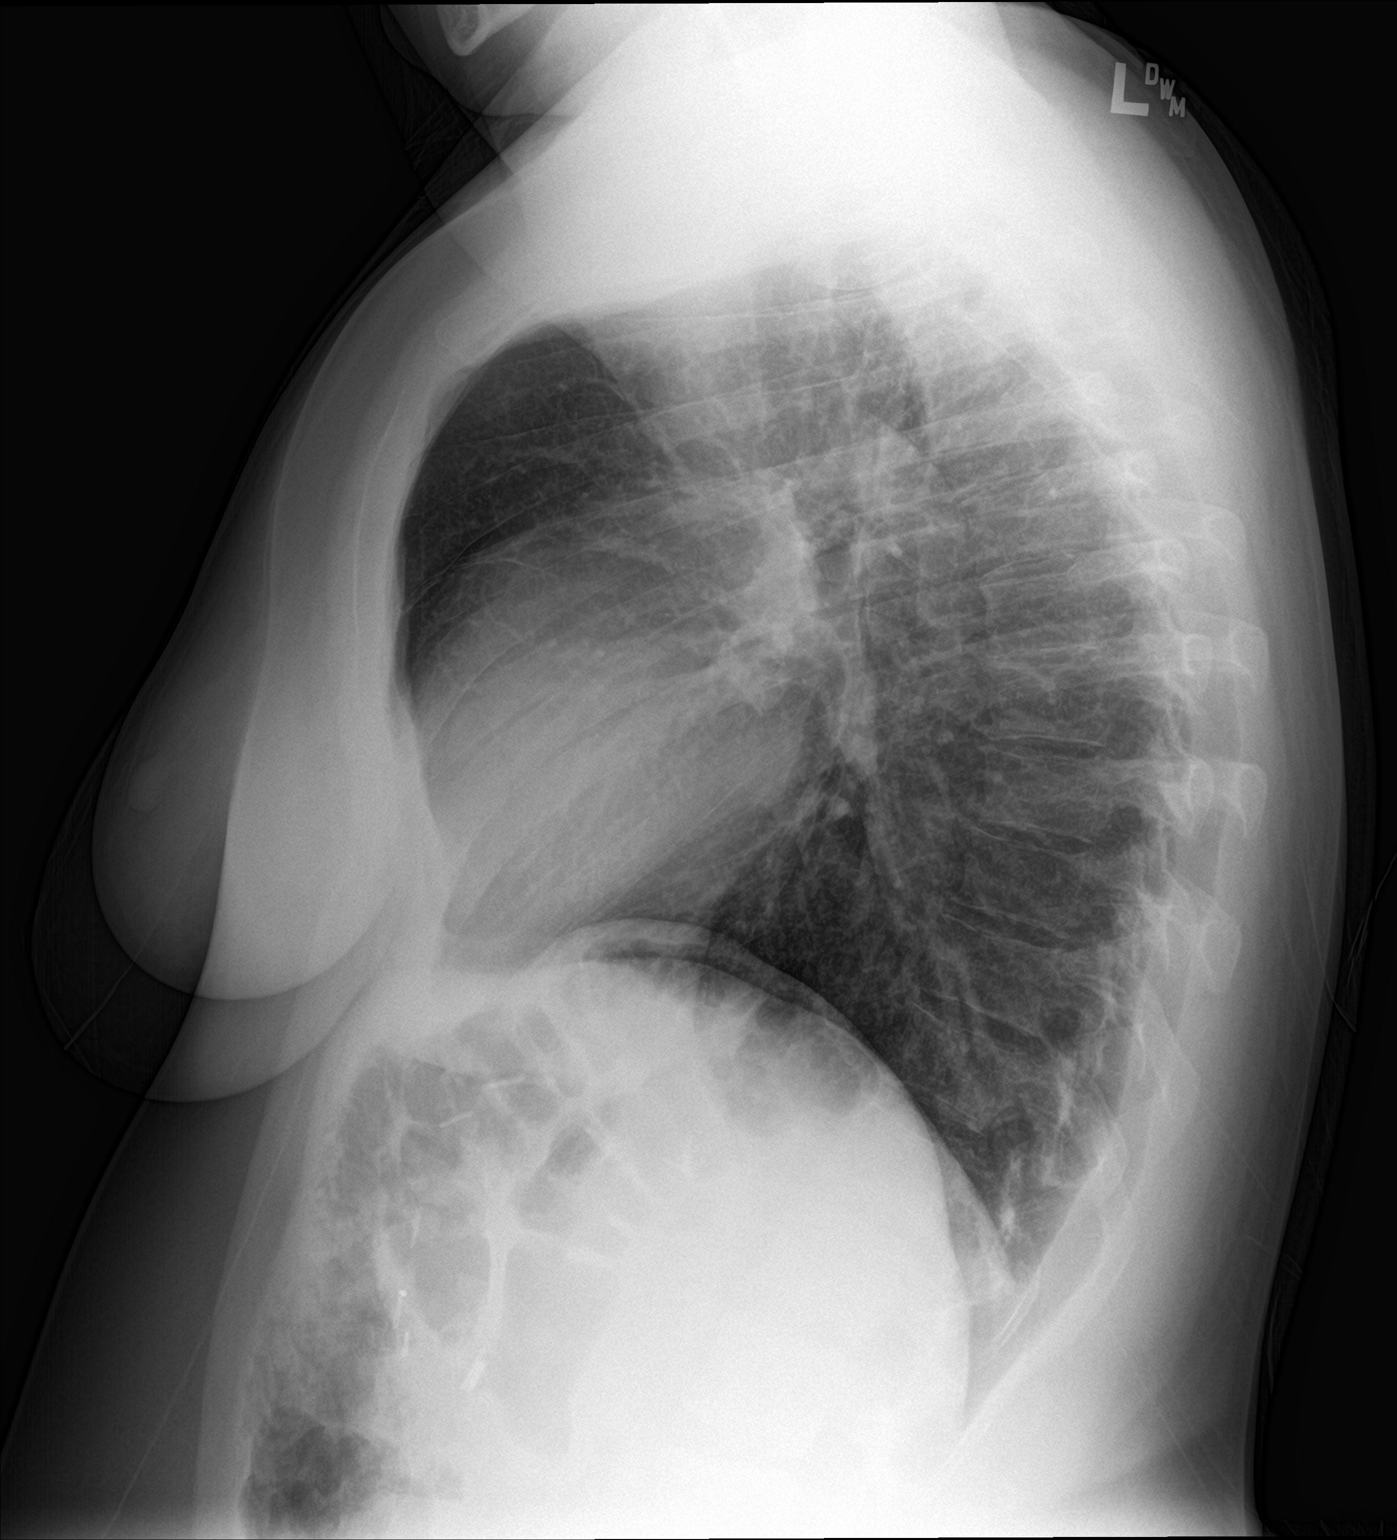

[2 of 2 positions shown; findings below may reference images not displayed]

FINDINGS: The heart size and mediastinal contours are within normal limits.
Both lungs are clear. No pleural effusion or pneumothorax. The
visualized skeletal structures are unremarkable.
IMPRESSION: No active cardiopulmonary disease.

## 2018-05-07 ENCOUNTER — Other Ambulatory Visit: Payer: Self-pay | Admitting: Family Medicine

## 2018-05-07 MED ORDER — ZOLPIDEM TARTRATE 10 MG PO TABS: 10.0000 mg | ORAL_TABLET | Freq: Every evening | ORAL | 0 refills | Status: DC | PRN

## 2018-06-02 ENCOUNTER — Other Ambulatory Visit: Payer: Self-pay | Admitting: Family Medicine

## 2018-06-05 ENCOUNTER — Other Ambulatory Visit: Payer: Self-pay | Admitting: Family Medicine

## 2018-06-05 ENCOUNTER — Other Ambulatory Visit: Payer: Self-pay | Admitting: Hematology & Oncology

## 2018-06-05 DIAGNOSIS — E559 Vitamin D deficiency, unspecified: Secondary | ICD-10-CM

## 2018-06-05 MED ORDER — VITAMIN D (ERGOCALCIFEROL) 1.25 MG (50000 UNIT) PO CAPS: 50000.0000 [IU] | ORAL_CAPSULE | ORAL | 0 refills | Status: DC

## 2018-06-06 MED ORDER — ZOLPIDEM TARTRATE 10 MG PO TABS: 10.0000 mg | ORAL_TABLET | Freq: Every evening | ORAL | 0 refills | Status: DC | PRN

## 2018-06-29 ENCOUNTER — Other Ambulatory Visit: Payer: Self-pay | Admitting: Family Medicine

## 2018-06-29 ENCOUNTER — Other Ambulatory Visit: Payer: Self-pay

## 2018-06-29 MED ORDER — VORTIOXETINE HBR 20 MG PO TABS: 20.0000 mg | ORAL_TABLET | Freq: Every day | ORAL | 0 refills | Status: DC

## 2018-06-29 NOTE — Progress Notes (Signed)
Insurance requires 90d refill of Trintellix or will cost $266.00/sent to pharm/pt has OV 7.24.19/thx dmf

## 2018-07-18 ENCOUNTER — Other Ambulatory Visit: Payer: Self-pay

## 2018-07-18 ENCOUNTER — Ambulatory Visit (INDEPENDENT_AMBULATORY_CARE_PROVIDER_SITE_OTHER): Payer: BLUE CROSS/BLUE SHIELD | Admitting: Family Medicine

## 2018-07-18 ENCOUNTER — Encounter: Payer: Self-pay | Admitting: Family Medicine

## 2018-07-18 VITALS — BP 124/82 | HR 84 | Temp 97.8°F | Ht 66.0 in | Wt 266.4 lb

## 2018-07-18 DIAGNOSIS — F900 Attention-deficit hyperactivity disorder, predominantly inattentive type: Secondary | ICD-10-CM | POA: Diagnosis not present

## 2018-07-18 MED ORDER — AMPHETAMINE-DEXTROAMPHETAMINE 30 MG PO TABS: 15.0000 mg | ORAL_TABLET | Freq: Four times a day (QID) | ORAL | 0 refills | Status: DC

## 2018-07-18 MED ORDER — VORTIOXETINE HBR 20 MG PO TABS: 20.0000 mg | ORAL_TABLET | Freq: Every day | ORAL | 0 refills | Status: DC

## 2018-07-18 MED ORDER — VORTIOXETINE HBR 10 MG PO TABS: 10.0000 mg | ORAL_TABLET | Freq: Two times a day (BID) | ORAL | 5 refills | Status: DC

## 2018-07-18 MED ORDER — LIRAGLUTIDE -WEIGHT MANAGEMENT 18 MG/3ML ~~LOC~~ SOPN: 0.6000 mg | PEN_INJECTOR | Freq: Every day | SUBCUTANEOUS | 0 refills | Status: AC

## 2018-07-18 NOTE — Patient Instructions (Addendum)
Great to see you. We are increasing your dose of adderall.  Please update me in 1 month with how you are doing- either in office visit or via email/phone call. We are starting saxenda 0.3 mg daily x1 week, increase to 0.6 mg daily if well tolerated.  Please follow up with me in 1 month.

## 2018-07-18 NOTE — Assessment & Plan Note (Addendum)
>  15 minutes spent in face to face time with patient, >50% spent in counselling or coordination of care Deteriorated -symptoms not well managed now that she is working longer hours- will increase dose to adderall 30 mg- 1/2 tab po four times daily and follow up in 1 month. Pt looked up in [place] controlled substances data base- no red flags. She is due for updating contract and UDS.

## 2018-07-18 NOTE — Assessment & Plan Note (Addendum)
Discussed weight loss plan.  Pt would also like to discuss medication options- Morgan Burgess is a reasonable option for her. eRx sent- pt is to start with 0.3 mg daily and increase to 0.6 mg daily after one week if tolerated.  She will check her FSBS for me and keep me updated.  Needs to follow up with me in 1 month. Will plan to do a1c at that time.

## 2018-07-18 NOTE — Progress Notes (Signed)
Subjective:   Patient ID: Morgan Burgess, female    DOB: 02/05/1981, 37 y.o.   MRN: [id number]  Morgan Burgess is a pleasant 37 y.o. year old female who presents to clinic today with ADHD (Patient is here today to F/U with ADHD.  She currently takes Adderall 15mg  1tid and is compliant per [place] PMP. She feels that she is working 19-20 hours a day and is feeling that sometimes she requires a 4th tablet.  Is asking if she can to up to 30mg  and do 1/2 qid prn to help with the long hours so she can remain focused.) and Weight Loss (Patient is wanting you to consider her trying Sexanda.  Her ins will cover this due to contraindication due to having Rou-[place].  Also cannot do the Belviq if on an SSRI. She has brought in a PA with complete information in case you approve as she weighs 266.4 lbs.)  on 07/18/2018  HPI: Here to follow up ADHD. Diagnosed by formal psychology evaluation. Currently taking Adderall 15 mg three times daily.  She is now working 19-20 hours per day and feeling like sometimes 15 mg three times daily is not as effective as it used to be.  She is asking if she can increase to 30 mg - 1/2 tab po four times daily to help with longer hours and keeping her focus during those long days (not every day).  Pt reviewed in [place] controlled substances database on 07/17/18.  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 She has done research about what her insurance will cover.  They will cover sexana. She cannot take Belviq since she is on an SSRI and that would increase her risk of serotonin syndrome.  Current Outpatient Medications on File Prior to Visit  Medication Sig Dispense Refill  . DUEXIS 800-26.6 MG TABS TAKE 1 TABLET BY MOUTH THREE TIMES DAILY 90 tablet 0  . levocetirizine (XYZAL) 5 MG tablet Take 5 mg by mouth every evening.    Marland Kitchen levonorgestrel (MIRENA) 20 MCG/24HR IUD 1 each by Intrauterine route continuous.    Marland Kitchen LORazepam (ATIVAN) 0.5 MG tablet Take 1 tablet (0.5 mg total) by mouth  every 8 (eight) hours as needed. 30 tablet 0  . Vitamin D, Ergocalciferol, (DRISDOL) 50000 units CAPS capsule Take 1 capsule (50,000 Units total) by mouth 2 (two) times a week. 24 capsule 0  . vortioxetine HBr (TRINTELLIX) 10 MG TABS tablet Take 10 mg by mouth 2 (two) times daily.    Marland Kitchen zolpidem (AMBIEN) 10 MG tablet TAKE 1 TABLET BY MOUTH AT BEDTIME AS NEEDED 30 tablet 2  . vortioxetine HBr (TRINTELLIX) 20 MG TABS tablet Take 1 tablet (20 mg total) by mouth daily. (Patient not taking: Reported on 07/18/2018) 90 tablet 0   No current facility-administered medications on file prior to visit.     Allergies  Allergen Reactions  . Latex Hives    Past Medical History:  Diagnosis Date  . Depression     Past Surgical History:  Procedure Laterality Date  . CHOLECYSTECTOMY  2004  . DILATION AND CURETTAGE OF UTERUS  2012  . GASTRIC BYPASS  2004    Family History  Problem Relation Age of Onset  . Hypertension Mother   . Hypertension Father   . Diabetes Neg Hx   . Heart attack Neg Hx     Social History   Socioeconomic History  . Marital status: Married    Spouse name: Not on file  .  Number of children: Not on file  . Years of education: Not on file  . Highest education level: Not on file  Occupational History  . Not on file  Social Needs  . Financial resource strain: Not on file  . Food insecurity:    Worry: Not on file    Inability: Not on file  . Transportation needs:    Medical: Not on file    Non-medical: Not on file  Tobacco Use  . Smoking status: Never Smoker  . Smokeless tobacco: Never Used  Substance and Sexual Activity  . Alcohol use: No  . Drug use: No  . Sexual activity: Yes  Lifestyle  . Physical activity:    Days per week: Not on file    Minutes per session: Not on file  . Stress: Not on file  Relationships  . Social connections:    Talks on phone: Not on file    Gets together: Not on file    Attends religious service: Not on file    Active member  of club or organization: Not on file    Attends meetings of clubs or organizations: Not on file    Relationship status: Not on file  . Intimate partner violence:    Fear of current or ex partner: Not on file    Emotionally abused: Not on file    Physically abused: Not on file    Forced sexual activity: Not on file  Other Topics Concern  . Not on file  Social History Narrative  . Not on file   The PMH, PSH, Social History, Family History, Medications, and allergies [facility], and have been updated if relevant.  Review of Systems  Constitutional: Negative.   Respiratory: Negative.   Cardiovascular: Negative.   Psychiatric/Behavioral: Positive for decreased concentration. Negative for agitation, behavioral problems, confusion, dysphoric mood, hallucinations, self-injury, sleep disturbance and suicidal ideas. The patient is not nervous/anxious (.lastweight3) and is not hyperactive.   All other systems reviewed and are negative.      Objective:    BP 124/82 (BP Location: Left Arm, Patient Position: Sitting, Cuff Size: Normal)   Pulse 84   Temp 97.8 F (36.6 C) (Oral)   Ht 5\' 6"  (1.676 m)   Wt 266 lb 6.4 oz (120.8 kg)   SpO2 98%   BMI 43.00 kg/m   Wt Readings from Last 3 Encounters:  07/18/18 266 lb 6.4 oz (120.8 kg)  01/25/18 259 lb 9.6 oz (117.8 kg)  12/22/17 255 lb (115.7 kg)      Physical Exam  Constitutional: She is oriented to person, place, and time. She appears well-developed and well-nourished. No distress.  HENT:  Head: Normocephalic and atraumatic.  Eyes: EOM are normal.  Neck: Normal range of motion.  Cardiovascular: Normal rate.  Pulmonary/Chest: Effort normal.  Abdominal: Soft.  Musculoskeletal: Normal range of motion.  Neurological: She is alert and oriented to person, place, and time. No cranial nerve deficit.  Skin: Skin is warm and dry. She is not diaphoretic.  Psychiatric: She has a normal mood and affect. Her behavior is normal.  Judgment and thought content normal.  Nursing note and vitals reviewed.         Assessment & Plan:   Attention deficit hyperactivity disorder (ADHD), predominantly inattentive type - Plan: Pain Mgmt, Profile 8 w/Conf, U  Severe obesity (BMI >= 40) (HCC) No follow-ups on file.

## 2018-07-21 LAB — PAIN MGMT, PROFILE 8 W/CONF, U
6 Acetylmorphine: NEGATIVE ng/mL (ref <10)
AMPHETAMINES: POSITIVE ng/mL — AB (ref <500)
Alcohol Metabolites: NEGATIVE ng/mL (ref <500)
Amphetamine: 5090 ng/mL — ABNORMAL HIGH (ref <250)
Benzodiazepines: NEGATIVE ng/mL (ref <100)
Buprenorphine, Urine: NEGATIVE ng/mL (ref <5)
CREATININE: 111.8 mg/dL
Cocaine Metabolite: NEGATIVE ng/mL (ref <150)
MARIJUANA METABOLITE: NEGATIVE ng/mL (ref <20)
MDMA: NEGATIVE ng/mL (ref <500)
Methamphetamine: NEGATIVE ng/mL (ref <250)
Opiates: NEGATIVE ng/mL (ref <100)
Oxidant: NEGATIVE ug/mL (ref <200)
Oxycodone: NEGATIVE ng/mL (ref <100)
PH: 6.95 (ref 4.5–9.0)

## 2018-08-21 ENCOUNTER — Encounter: Payer: Self-pay | Admitting: Family Medicine

## 2018-08-21 ENCOUNTER — Other Ambulatory Visit: Payer: Self-pay | Admitting: Family Medicine

## 2018-08-22 ENCOUNTER — Encounter: Payer: Self-pay | Admitting: Family Medicine

## 2018-08-22 NOTE — Telephone Encounter (Signed)
In addition to this pt sent my chart message about this. Please advise.   Good morning happy Wednesday!  So when I was at the pharmacy last night talking with them they recommended possibly Prozac would be a similar medication to the trintlex which Dr. Beverely Lowabori done when she started me on trintlex when I was having insurance issues before trying to get it approved at the $10 co-pay and I never filled the prescription so I was going to go ahead and fill it last night and see if that would work however it had timed out on July 30 so I wanted to see if maybe we can do a refill for the Prozac so I can initiate that and see what the cost is comparable to the trend telex because I just can't do $350 a month for medication. Also if I can get a status update on maybe the Belvique. Thanks and hope you guys have a great day

## 2018-08-22 NOTE — Telephone Encounter (Signed)
Dr. Dayton MartesAron please advise, last office visit with you were 07/18/2018. Dr. Beverely Lowabori discontinued this med since 09/2017.

## 2018-08-24 NOTE — Telephone Encounter (Signed)
[facility] requesting Rx for Prozac, instead of Trintellix due to cost of medication, $350/mo. Per Brayton CavesJessie, [facility] agent, the patient voiced that this medication is too expensive & would like an alternative.  Dr. Dayton MartesAron - please advise

## 2018-08-30 NOTE — Telephone Encounter (Signed)
Copied from CRM [id number]. Topic: Inquiry >> Aug 28, 2018  3:07 PM Crist Infante wrote: Reason for CRM: Houng at [facility] calling returning Michelle's call. They say they are requesting vortioxetine HBr (TRINTELLIX) 20 MG TABS tablet  (not Belviq) wants to make sure you have the right pt/pharmacy?

## 2018-08-31 ENCOUNTER — Other Ambulatory Visit: Payer: Self-pay | Admitting: Family Medicine

## 2018-08-31 MED ORDER — LORCASERIN HCL 10 MG PO TABS
1.0000 | ORAL_TABLET | Freq: Two times a day (BID) | ORAL | 1 refills | Status: DC
Start: 2018-08-31 — End: 2018-09-05

## 2018-09-03 NOTE — Telephone Encounter (Signed)
Patient is calling to check the status of the prior autho on BELVIQ. Patient feels like she and michelle are playing phone tag. Please advise.

## 2018-09-03 NOTE — Telephone Encounter (Signed)
TA-Pt is scheduled for Dec/CSC, UDS are UTD/thx dmf

## 2018-09-05 ENCOUNTER — Other Ambulatory Visit: Payer: Self-pay | Admitting: Family Medicine

## 2018-09-06 NOTE — Telephone Encounter (Signed)
TA-I gave pt the information for a copay card/can you please send in this refill for the Belviq? Plz advise/thx dmf

## 2018-10-05 ENCOUNTER — Other Ambulatory Visit: Payer: Self-pay | Admitting: Family Medicine

## 2018-10-05 MED ORDER — IBUPROFEN-FAMOTIDINE 800-26.6 MG PO TABS: 1.0000 | ORAL_TABLET | Freq: Three times a day (TID) | ORAL | 0 refills | Status: DC

## 2018-10-05 MED ORDER — FLUOXETINE HCL 20 MG PO TABS: ORAL_TABLET | ORAL | 0 refills | Status: DC

## 2018-10-28 ENCOUNTER — Other Ambulatory Visit: Payer: Self-pay | Admitting: Family Medicine

## 2018-10-29 ENCOUNTER — Encounter: Payer: Self-pay | Admitting: Family Medicine

## 2018-10-30 ENCOUNTER — Other Ambulatory Visit: Payer: Self-pay | Admitting: Family Medicine

## 2018-10-30 ENCOUNTER — Other Ambulatory Visit: Payer: Self-pay

## 2018-10-30 MED ORDER — ZOLPIDEM TARTRATE 10 MG PO TABS: 10.0000 mg | ORAL_TABLET | Freq: Every day | ORAL | 0 refills | Status: DC

## 2018-10-30 MED ORDER — FLUOXETINE HCL 20 MG PO TABS: ORAL_TABLET | ORAL | 0 refills | Status: DC

## 2018-10-30 MED ORDER — PHENTERMINE HCL 37.5 MG PO CAPS: 37.5000 mg | ORAL_CAPSULE | ORAL | 2 refills | Status: DC

## 2018-10-31 ENCOUNTER — Other Ambulatory Visit: Payer: Self-pay

## 2018-10-31 MED ORDER — FLUOXETINE HCL 20 MG PO TABS: ORAL_TABLET | ORAL | 0 refills | Status: DC

## 2018-11-01 ENCOUNTER — Other Ambulatory Visit: Payer: Self-pay

## 2018-11-01 DIAGNOSIS — E559 Vitamin D deficiency, unspecified: Secondary | ICD-10-CM

## 2018-11-01 DIAGNOSIS — Z0000 Encounter for general adult medical examination without abnormal findings: Secondary | ICD-10-CM

## 2018-11-25 ENCOUNTER — Other Ambulatory Visit: Payer: Self-pay | Admitting: Hematology & Oncology

## 2018-11-25 DIAGNOSIS — E559 Vitamin D deficiency, unspecified: Secondary | ICD-10-CM

## 2018-12-04 ENCOUNTER — Encounter: Payer: Self-pay | Admitting: Family Medicine

## 2018-12-10 ENCOUNTER — Ambulatory Visit: Payer: Self-pay | Admitting: Family Medicine

## 2018-12-17 LAB — BASIC METABOLIC PANEL
BUN: 10 (ref 4–21)
Creatinine: 0.8 (ref 0.5–1.1)
Glucose: 87
POTASSIUM: 4 (ref 3.4–5.3)
Sodium: 140 (ref 137–147)

## 2018-12-17 LAB — CBC AND DIFFERENTIAL
HCT: 36 (ref 36–46)
HEMOGLOBIN: 12.6 (ref 12.0–16.0)
Neutrophils Absolute: 3
Platelets: 247 (ref 150–399)
WBC: 6

## 2018-12-17 LAB — HEPATIC FUNCTION PANEL
ALT: 10 (ref 7–35)
AST: 16 (ref 13–35)
Alkaline Phosphatase: 105 (ref 25–125)
BILIRUBIN DIRECT: 0.13 (ref 0.01–0.4)
Bilirubin, Total: 0.4

## 2018-12-17 LAB — VITAMIN B12: Vitamin B-12: 326

## 2018-12-17 LAB — LIPID PANEL
Cholesterol: 157 (ref 0–200)
HDL: 65 (ref 35–70)
LDL Cholesterol: 84
Triglycerides: 41 (ref 40–160)

## 2018-12-17 LAB — VITAMIN D 25 HYDROXY (VIT D DEFICIENCY, FRACTURES): Vit D, 25-Hydroxy: 42

## 2018-12-17 LAB — HEMOGLOBIN A1C: Hemoglobin A1C: 5

## 2018-12-17 LAB — TSH: TSH: 4.23 (ref 0.41–5.90)

## 2018-12-17 LAB — IRON,TIBC AND FERRITIN PANEL
%SAT: 18
Iron: 63
TIBC: 352
UIBC: 289

## 2018-12-19 NOTE — Progress Notes (Signed)
Subjective:   Patient ID: Morgan MatteMelissa Burgess, female    DOB: 02/02/1981, [age] y.o.   MRN: [id number]  Morgan Burgess is a pleasant 37 y.o. year old female who presents to clinic today with Annual Exam (Patient is here today for a CPE without PAP.  Will have PAP completed by GYN in March 2020 and will have IUD replaced at that time.  There are future orders placed in system for Lipid/CMP/CBC/Vit-D and she had those completed on 12.23.19 and additional labs.  She is not currently fasting.  Declines flu shot.)  on 12/20/2018  HPI:  Zamyra established care with me on 01/25/18.  Note reviewed.  Here for CPX without pap and follow up of chronic medical conditions.  Had labs done on 12/17/18.  Brings them in today.  Has GYN- has pap smear scheduled for 02/2019.  She plans on having her IUD replaced at that OV.  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 Currently taking phentermine 37.5 mg daily. No CP, SOB or palpitations.  Wt Readings from Last 3 Encounters:  12/20/18 263 lb 6.4 oz (119.5 kg)  07/18/18 266 lb 6.4 oz (120.8 kg)  01/25/18 259 lb 9.6 oz (117.8 kg)    GAD- improved when we increased her Trilentix to 20 mg daily but it was cost prohibitive $350/month.  She is currently taking prozac 20 mg daily with rare use of ativan (last filled in 12/2017).  [facility] 2/9 12/20/2018 10/04/2017 12/22/2016 11/23/2015 06/01/2015  Decreased Interest 0 0 0 0 0  Down, Depressed, Hopeless 0 0 0 0 0  PHQ - 2 Score 0 0 0 0 0  Altered sleeping - 0 0 - -  Tired, decreased energy - 0 0 - -  Change in appetite - 0 0 - -  Feeling bad or failure about yourself  - 0 0 - -  Trouble concentrating - 0 0 - -  Moving slowly or fidgety/restless - 0 0 - -  Suicidal thoughts - 0 0 - -  PHQ-9 Score - 0 0 - -  Difficult doing work/chores - Not difficult at all - - -   GAD 7 : Generalized Anxiety Score 12/20/2018  Nervous, Anxious, on Edge 0  Control/stop worrying 0  Worry too much - different things  0  Trouble relaxing 1  Restless 1  Easily annoyed or irritable 0  Afraid - awful might happen 0  Total GAD 7 Score 2       Insomnia- taking ambien 10 mg daily at bedtime.  Current Outpatient Medications on File Prior to Visit  Medication Sig Dispense Refill  . FLUoxetine (PROZAC) 20 MG tablet TAKE 1 TABLET BY MOUTH DAILY 90 tablet 0  . Ibuprofen-Famotidine (DUEXIS) 800-26.6 MG TABS Take 1 tablet by mouth 3 (three) times daily. 90 tablet 0  . levocetirizine (XYZAL) 5 MG tablet Take 5 mg by mouth every evening.    Marland Kitchen. levonorgestrel (MIRENA) 20 MCG/24HR IUD 1 each by Intrauterine route continuous.    Marland Kitchen. LORazepam (ATIVAN) 0.5 MG tablet Take 1 tablet (0.5 mg total) by mouth every 8 (eight) hours as needed. 30 tablet 0  . phentermine 37.5 MG capsule Take 1 capsule (37.5 mg total) by mouth every morning. 30 capsule 2  . Vitamin D, Ergocalciferol, (DRISDOL) 1.25 MG (50000 UT) CAPS capsule TAKE 1 CAPSULE BY MOUTH 2 TIMES A WEEK 24 capsule 0  . zolpidem (AMBIEN) 10 MG tablet Take 1 tablet (10 mg total) by mouth at bedtime.  90 tablet 0   No current facility-administered medications on file prior to visit.     Allergies  Allergen Reactions  . Latex Hives    Past Medical History:  Diagnosis Date  . Depression     Past Surgical History:  Procedure Laterality Date  . CHOLECYSTECTOMY  2004  . DILATION AND CURETTAGE OF UTERUS  2012  . GASTRIC BYPASS  2004    Family History  Problem Relation Age of Onset  . Hypertension Mother   . Hypertension Father   . Diabetes Neg Hx   . Heart attack Neg Hx     Social History   Socioeconomic History  . Marital status: Married    Spouse name: Not on file  . Number of children: Not on file  . Years of education: Not on file  . Highest education level: Not on file  Occupational History  . Not on file  Social Needs  . Financial resource strain: Not on file  . Food insecurity:    Worry: Not on file    Inability: Not on file  .  Transportation needs:    Medical: Not on file    Non-medical: Not on file  Tobacco Use  . Smoking status: Never Smoker  . Smokeless tobacco: Never Used  Substance and Sexual Activity  . Alcohol use: No  . Drug use: No  . Sexual activity: Yes  Lifestyle  . Physical activity:    Days per week: Not on file    Minutes per session: Not on file  . Stress: Not on file  Relationships  . Social connections:    Talks on phone: Not on file    Gets together: Not on file    Attends religious service: Not on file    Active member of club or organization: Not on file    Attends meetings of clubs or organizations: Not on file    Relationship status: Not on file  . Intimate partner violence:    Fear of current or ex partner: Not on file    Emotionally abused: Not on file    Physically abused: Not on file    Forced sexual activity: Not on file  Other Topics Concern  . Not on file  Social History Narrative  . Not on file   The PMH, PSH, Social History, Family History, Medications, and allergies have [facility], and have been updated if relevant.  Review of Systems  Constitutional: Negative.   HENT: Negative.   Eyes: Negative.   Respiratory: Negative.   Cardiovascular: Negative.   Gastrointestinal: Negative.   Endocrine: Negative.   Genitourinary: Negative.   Musculoskeletal: Negative.   Allergic/Immunologic: Negative.   Neurological: Negative.   Hematological: Negative.   Psychiatric/Behavioral: Negative.   All other systems reviewed and are negative.      Objective:    BP 124/84 (BP Location: Left Arm, Patient Position: Sitting, Cuff Size: Normal)   Pulse 84   Temp 98.4 F (36.9 C) (Oral)   Ht 5\' 6"  (1.676 m)   Wt 263 lb 6.4 oz (119.5 kg)   SpO2 100%   BMI 42.51 kg/m    Physical Exam   General:  Well-developed,well-nourished,in no acute distress; alert,appropriate and cooperative throughout examination Head:  normocephalic and atraumatic.   Eyes:  vision  grossly intact, PERRL Ears:  R ear normal and L ear normal externally, TMs clear bilaterally Nose:  no external deformity.   Mouth:  good dentition.   Neck:  No  deformities, masses, or tenderness noted.  Lungs:  Normal respiratory effort, chest expands symmetrically. Lungs are clear to auscultation, no crackles or wheezes. Heart:  Normal rate and regular rhythm. S1 and S2 normal without gallop, murmur, click, rub or other extra sounds. Abdomen:  Bowel sounds positive,abdomen soft and non-tender without masses, organomegaly or hernias noted. Msk:  No deformity or scoliosis noted of thoracic or lumbar spine.   Extremities:  No clubbing, cyanosis, edema, or deformity noted with normal full range of motion of all joints.   Neurologic:  alert & oriented X3 and gait normal.   Skin:  Intact without suspicious lesions or rashes Cervical Nodes:  No lymphadenopathy noted Axillary Nodes:  No palpable lymphadenopathy Psych:  Cognition and judgment appear intact. Alert and cooperative with normal attention span and concentration. No apparent delusions, illusions, hallucinations        Assessment & Plan:   Generalized anxiety disorder  Other iron deficiency anemia - Plan: CANCELED: Ferritin  Severe obesity (BMI >= 40) (HCC)  Vitamin D deficiency  Iron malabsorption  Vitamin B12 deficiency - Plan: CANCELED: B12  Well woman exam without gynecological exam No follow-ups on file.

## 2018-12-20 ENCOUNTER — Ambulatory Visit (INDEPENDENT_AMBULATORY_CARE_PROVIDER_SITE_OTHER): Payer: BLUE CROSS/BLUE SHIELD | Admitting: Family Medicine

## 2018-12-20 ENCOUNTER — Encounter: Payer: Self-pay | Admitting: Family Medicine

## 2018-12-20 VITALS — BP 124/84 | HR 84 | Temp 98.4°F | Ht 66.0 in | Wt 263.4 lb

## 2018-12-20 DIAGNOSIS — Z0000 Encounter for general adult medical examination without abnormal findings: Secondary | ICD-10-CM

## 2018-12-20 DIAGNOSIS — F411 Generalized anxiety disorder: Secondary | ICD-10-CM

## 2018-12-20 DIAGNOSIS — D508 Other iron deficiency anemias: Secondary | ICD-10-CM

## 2018-12-20 DIAGNOSIS — E538 Deficiency of other specified B group vitamins: Secondary | ICD-10-CM

## 2018-12-20 DIAGNOSIS — E559 Vitamin D deficiency, unspecified: Secondary | ICD-10-CM

## 2018-12-20 DIAGNOSIS — K909 Intestinal malabsorption, unspecified: Secondary | ICD-10-CM

## 2018-12-20 NOTE — Patient Instructions (Signed)
Great to see you. Happy Holidays.   

## 2018-12-20 NOTE — Progress Notes (Signed)
[facility]/pt name in their system is Morgan Burgess, Morgan Burgess/thx dmf

## 2018-12-20 NOTE — Assessment & Plan Note (Signed)
Vit D at appropriate level.

## 2018-12-20 NOTE — Assessment & Plan Note (Signed)
Reviewed preventive care protocols, scheduled due services, and updated immunizations Discussed nutrition, exercise, diet, and healthy lifestyle.  Labs up to date.

## 2018-12-20 NOTE — Assessment & Plan Note (Signed)
Well controlled on current dose of prozac. Continue current rx.

## 2018-12-20 NOTE — Assessment & Plan Note (Signed)
Discussed weight loss plan. She is please with current dose of phentermine.   She is aware of phentermine risk benefits, side effects including HTN, pulmonary HTN, stroke.    continue phentermine and lifestyle changes.

## 2019-01-08 ENCOUNTER — Telehealth: Payer: Self-pay

## 2019-01-08 NOTE — Telephone Encounter (Signed)
PA for Belviq approved . KEY: [id number]

## 2019-01-23 ENCOUNTER — Other Ambulatory Visit: Payer: Self-pay | Admitting: Family Medicine

## 2019-01-24 ENCOUNTER — Encounter: Payer: Self-pay | Admitting: Family Medicine

## 2019-01-24 ENCOUNTER — Other Ambulatory Visit: Payer: Self-pay | Admitting: Family Medicine

## 2019-01-24 ENCOUNTER — Other Ambulatory Visit: Payer: Self-pay

## 2019-01-24 MED ORDER — IBUPROFEN-FAMOTIDINE 800-26.6 MG PO TABS: 1.0000 | ORAL_TABLET | Freq: Three times a day (TID) | ORAL | 0 refills | Status: DC

## 2019-01-24 MED ORDER — LORAZEPAM 0.5 MG PO TABS: 0.5000 mg | ORAL_TABLET | Freq: Three times a day (TID) | ORAL | 0 refills | Status: DC | PRN

## 2019-02-06 ENCOUNTER — Encounter: Payer: Self-pay | Admitting: Family Medicine

## 2019-02-08 ENCOUNTER — Other Ambulatory Visit: Payer: Self-pay

## 2019-02-08 ENCOUNTER — Other Ambulatory Visit: Payer: Self-pay | Admitting: Family Medicine

## 2019-02-08 MED ORDER — FLUCONAZOLE 150 MG PO TABS: 150.0000 mg | ORAL_TABLET | Freq: Once | ORAL | 0 refills | Status: AC

## 2019-02-11 NOTE — Telephone Encounter (Signed)
TA-Looks like this was printed on 1.30.20 but per [place] PMP pt has not had this filled since 1.14.19/she is UTD with [facility] & UDS/plz advise/thx dmf

## 2019-02-13 ENCOUNTER — Other Ambulatory Visit: Payer: Self-pay

## 2019-02-13 NOTE — Progress Notes (Signed)
TA-LOV 12.2.19/NOV: 12.24.20/per [place] PMP she is compliant without red flags/she is UTD with [facility] & UDS/plz advise/thx dmf

## 2019-02-14 MED ORDER — ZOLPIDEM TARTRATE 10 MG PO TABS: 10.0000 mg | ORAL_TABLET | Freq: Every day | ORAL | 1 refills | Status: DC

## 2019-02-14 NOTE — Progress Notes (Signed)
TA-Yes this patient is requesting to mail order/thx dmf

## 2019-02-14 NOTE — Progress Notes (Signed)
eRx sent to mail order pharmacy as requested.

## 2019-03-03 ENCOUNTER — Other Ambulatory Visit: Payer: Self-pay | Admitting: Hematology & Oncology

## 2019-03-03 DIAGNOSIS — E559 Vitamin D deficiency, unspecified: Secondary | ICD-10-CM

## 2019-03-08 DIAGNOSIS — R319 Hematuria, unspecified: Secondary | ICD-10-CM | POA: Diagnosis not present

## 2019-03-23 ENCOUNTER — Encounter: Payer: Self-pay | Admitting: Family Medicine

## 2019-04-02 ENCOUNTER — Telehealth: Payer: Self-pay | Admitting: Family Medicine

## 2019-04-02 NOTE — Telephone Encounter (Signed)
Need PA form signed by Dr. Dayton Martes. It's all filled out ready to be faxed back. JAP

## 2019-04-02 NOTE — Telephone Encounter (Signed)
Copied from CRM [id number]. Topic: Quick Communication - See Telephone Encounter >> Apr 02, 2019  1:10 PM Mickel Baas B, NT wrote: CRM for notification. See Telephone encounter for: 04/02/19. Vikki Ports with [employer] calling to check the status of a PA that the pharmacy started for the patient's Saxenda Pens. Reference key: [id number] CB#: [phone]

## 2019-04-03 ENCOUNTER — Ambulatory Visit (INDEPENDENT_AMBULATORY_CARE_PROVIDER_SITE_OTHER): Payer: BLUE CROSS/BLUE SHIELD | Admitting: Family Medicine

## 2019-04-03 DIAGNOSIS — F900 Attention-deficit hyperactivity disorder, predominantly inattentive type: Secondary | ICD-10-CM

## 2019-04-03 DIAGNOSIS — Z9884 Bariatric surgery status: Secondary | ICD-10-CM

## 2019-04-03 DIAGNOSIS — F5081 Binge eating disorder: Secondary | ICD-10-CM

## 2019-04-03 MED ORDER — FLUOXETINE HCL 20 MG PO TABS: ORAL_TABLET | ORAL | 3 refills | Status: DC

## 2019-04-03 MED ORDER — ZOLPIDEM TARTRATE 10 MG PO TABS: 10.0000 mg | ORAL_TABLET | Freq: Every day | ORAL | 1 refills | Status: DC

## 2019-04-03 MED ORDER — AMPHETAMINE-DEXTROAMPHETAMINE 30 MG PO TABS: 15.0000 mg | ORAL_TABLET | Freq: Four times a day (QID) | ORAL | 0 refills | Status: DC

## 2019-04-03 MED ORDER — LIRAGLUTIDE -WEIGHT MANAGEMENT 18 MG/3ML ~~LOC~~ SOPN: 0.6000 mg | PEN_INJECTOR | Freq: Every day | SUBCUTANEOUS | 3 refills | Status: DC

## 2019-04-03 NOTE — Progress Notes (Signed)
TELEPHONE ENCOUNTER   Patient verbally agreed to telephone visit and is aware that copayment and coinsurance may apply. Patient was treated using telemedicine according to accepted telemedicine protocols.  Location of the patient: she is at work [place] Names of all persons participating in the telemedicine service and role in the encounter: Ruthe Mannanalia , MD Louis MatteMelissa Gadomski  Subjective:     HPI  Morbid obesity-  Remote h/o roux en Y bypass. Currently taking phentermine 37.5 mg daily.   Belviq has been taken off the market.  She sent the following mychart message on 03/23/19:   Ok so I have noticed that I can't seem to go w out the adderral and just the Phentermine... I have it 3 months and work just isn't the same I have gotten so far behind bc I can't keep up that I am wanting to just do adderral and saxenda and bit the bullet and pay the $125 for it Unless the victoza or  Trulicity is covered buy my insurance. I just noticed I can do w out that and try and continue to work especially w all the epidemic going on I need to focus more then anything. Let me know what your thoughts are and recommendation. Hope you have a great weekend and be safe. Savera   She was taking Adderall 15 mg three times daily but stopped taking it when she asked to start phentermine.  I was not comfortable with her taking both stimulants.  Pasty SpillersSexanda is a reasonable option for her.  eRx sent- pt is to start with 0.3 mg daily and increase to 0.6 mg daily after one week if tolerated.  She will check her FSBS for me and keep me updated.  Needs to follow up with me in 1 month. Will plan to do a1c at that time.  Lab Results  Component Value Date   HGBA1C 5.0 12/17/2018    Patient Active Problem List   Diagnosis Date Noted  . Binge eating disorder 10/04/2017  . Vitamin D deficiency 10/28/2016  . IDA (iron deficiency anemia) 09/28/2016  . 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  Attention deficit hyperactivity disorder (ADHD) 09/16/2016  . Severe obesity (BMI >= 40) (HCC) 09/16/2016  . Thinning hair 11/28/2014  . Generalized anxiety disorder 11/01/2013  . S/P gastric bypass 11/01/2013  . Metatarsalgia 05/16/2011   Social History   Tobacco Use  . Smoking status: Never Smoker  . Smokeless tobacco: Never Used  Substance Use Topics  . Alcohol use: No    Current Outpatient Medications:  .  FLUoxetine (PROZAC) 20 MG tablet, TAKE 1 TABLET BY MOUTH DAILY, Disp: 90 tablet, Rfl: 0 .  Ibuprofen-Famotidine (DUEXIS) 800-26.6 MG TABS, Take 1 tablet by mouth 3 (three) times daily., Disp: 90 tablet, Rfl: 0 .  levocetirizine (XYZAL) 5 MG tablet, Take 5 mg by mouth every evening., Disp: , Rfl:  .  levonorgestrel (MIRENA) 20 MCG/24HR IUD, 1 each by Intrauterine route continuous., Disp: , Rfl:  .  LORazepam (ATIVAN) 0.5 MG tablet, TAKE 1 TABLET BY MOUTH EVERY 8 HOURS AS NEEDED, Disp: 30 tablet, Rfl: 2 .  Vitamin D, Ergocalciferol, (DRISDOL) 1.25 MG (50000 UT) CAPS capsule, TAKE 1 CAPSULE BY MOUTH 2 TIMES A WEEK, Disp: 24 capsule, Rfl: 0 .  zolpidem (AMBIEN) 10 MG tablet, Take 1 tablet (10 mg total) by mouth at bedtime., Disp: 90 tablet, Rfl: 1 Allergies  Allergen Reactions  . Latex Hives    Assessment & Plan:  1. Binge eating disorder   2. Severe obesity (BMI >= 40) (HCC)   3. S/P gastric bypass   4. Attention deficit hyperactivity disorder (ADHD), predominantly inattentive type     No orders of the defined types were placed in this encounter.  No orders of the defined types were placed in this encounter.   Ruthe Mannan, MD 04/03/2019  No charge for visit. Restart Adderall and Saxenda, d/c phentermine.  eRx sent- pt is to start with 0.3 mg daily and increase to 0.6 mg daily after one week if tolerated.  She will check her FSBS for me and keep me updated.  She will follow up with me in July for a1c and UDS/CSC update.

## 2019-04-04 NOTE — Telephone Encounter (Signed)
This form was signed and faxed on 4.8.20/thx dmf

## 2019-04-17 ENCOUNTER — Telehealth: Payer: Self-pay

## 2019-04-17 NOTE — Telephone Encounter (Signed)
Copied from CRM [id number]. Topic: General - Other >> Apr 17, 2019  8:32 AM Retta Diones wrote: Reason for CRM: Swetha from [employer] called about a prior authorization that was sent over for patient for Saxenda. She would like a call back.

## 2019-04-17 NOTE — Telephone Encounter (Signed)
PA approved for Saxenda from 3.15.20-8.20.20/sent pt a MyChart message informing her of this/thx dmf

## 2019-04-17 NOTE — Telephone Encounter (Signed)
Completed additional pages that were requested via the ins company/thx dmf

## 2019-04-27 ENCOUNTER — Encounter: Payer: Self-pay | Admitting: Family Medicine

## 2019-04-29 ENCOUNTER — Other Ambulatory Visit: Payer: Self-pay

## 2019-04-29 MED ORDER — PEN NEEDLES 31G X 6 MM MISC: 99 refills | Status: DC

## 2019-04-29 MED ORDER — OLOPATADINE HCL 0.2 % OP SOLN: OPHTHALMIC | 5 refills | Status: DC

## 2019-04-29 MED ORDER — INSULIN PEN NEEDLE 32G X 6 MM MISC: 3 refills | Status: DC

## 2019-05-10 DIAGNOSIS — Z01812 Encounter for preprocedural laboratory examination: Secondary | ICD-10-CM | POA: Diagnosis not present

## 2019-05-19 ENCOUNTER — Encounter: Payer: Self-pay | Admitting: Family Medicine

## 2019-05-19 ENCOUNTER — Other Ambulatory Visit: Payer: Self-pay | Admitting: Family Medicine

## 2019-05-21 MED ORDER — AMPHETAMINE-DEXTROAMPHETAMINE 30 MG PO TABS: 15.0000 mg | ORAL_TABLET | Freq: Four times a day (QID) | ORAL | 0 refills | Status: DC

## 2019-06-06 DIAGNOSIS — T8332XA Displacement of intrauterine contraceptive device, initial encounter: Secondary | ICD-10-CM | POA: Diagnosis not present

## 2019-06-06 DIAGNOSIS — Z30433 Encounter for removal and reinsertion of intrauterine contraceptive device: Secondary | ICD-10-CM | POA: Diagnosis not present

## 2019-06-14 ENCOUNTER — Other Ambulatory Visit: Payer: Self-pay | Admitting: Hematology & Oncology

## 2019-06-14 DIAGNOSIS — E559 Vitamin D deficiency, unspecified: Secondary | ICD-10-CM

## 2019-07-04 ENCOUNTER — Other Ambulatory Visit: Payer: Self-pay | Admitting: Family Medicine

## 2019-07-04 MED ORDER — AMPHETAMINE-DEXTROAMPHETAMINE 30 MG PO TABS: 15.0000 mg | ORAL_TABLET | Freq: Four times a day (QID) | ORAL | 0 refills | Status: DC

## 2019-07-09 ENCOUNTER — Other Ambulatory Visit: Payer: Self-pay | Admitting: Family Medicine

## 2019-07-09 DIAGNOSIS — E559 Vitamin D deficiency, unspecified: Secondary | ICD-10-CM

## 2019-07-24 ENCOUNTER — Other Ambulatory Visit: Payer: Self-pay | Admitting: Family Medicine

## 2019-08-19 ENCOUNTER — Encounter: Payer: Self-pay | Admitting: Family Medicine

## 2019-08-19 NOTE — Progress Notes (Deleted)
TELEPHONE ENCOUNTER   Patient verbally agreed to telephone visit and is aware that copayment and coinsurance may apply. Patient was treated using telemedicine according to accepted telemedicine protocols.  Location of the patient: home Location of provider:office Names of all persons participating in the telemedicine service and role in the encounter: Arnette Norris, MD [place]:   Chief Complaint  Patient presents with  . Sinusitis     HPI   Pt sent the following my chart message on 08/19/19:  " I feel I am about 95% sure I have a sinus infection.   My youngest daughter has had it 4 days now and was seen at urgent care this wknd and it has hit me also.   I am having lot of front head sinus pressure with a Runny nose. I have tried otc medications the last 3 days nothing seems to be helping. Let me know if that's possible or any chance to do visit later on today. Thanks in advance. Morgan Burgess"  No fevers or chills.   Patient Active Problem List   Diagnosis Date Noted  . Acute sinus infection 08/20/2019  . Binge eating disorder 10/04/2017  . Vitamin D deficiency 10/28/2016  . IDA (iron deficiency anemia) 09/28/2016  . 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 Attention deficit hyperactivity disorder (ADHD) 09/16/2016  . Severe obesity (BMI >= 40) ([place]) 09/16/2016  . Thinning hair 11/28/2014  . Generalized anxiety disorder 11/01/2013  . S/P gastric bypass 11/01/2013  . Metatarsalgia 05/16/2011   Social History   Tobacco Use  . Smoking status: Never Smoker  . Smokeless tobacco: Never Used  Substance Use Topics  . Alcohol use: No    Current Outpatient Medications:  .  amphetamine-dextroamphetamine (ADDERALL) 30 MG tablet, Take 0.5 tablets by mouth 4 (four) times daily., Disp: 90 tablet, Rfl: 0 .  DUEXIS 800-26.6 MG TABS, TAKE 1 TABLET BY MOUTH THREE TIMES DAILY, Disp: 90 tablet, Rfl: 0 .  FLUoxetine (PROZAC) 20 MG tablet, TAKE 1 TABLET BY MOUTH DAILY, Disp: 90  tablet, Rfl: 3 .  Insulin Pen Needle (NOVOFINE) 32G X 6 MM MISC, UAD daily with Saxenda, Disp: 100 each, Rfl: 3 .  levocetirizine (XYZAL) 5 MG tablet, Take 5 mg by mouth every evening., Disp: , Rfl:  .  levonorgestrel (MIRENA) 20 MCG/24HR IUD, 1 each by Intrauterine route continuous., Disp: , Rfl:  .  LORazepam (ATIVAN) 0.5 MG tablet, TAKE 1 TABLET BY MOUTH EVERY 8 HOURS AS NEEDED, Disp: 30 tablet, Rfl: 2 .  Olopatadine HCl 0.2 % SOLN, UAD OU BID, Disp: 5 mL, Rfl: 5 .  SAXENDA 18 MG/3ML SOPN, INJECT 0.6 MG INTO THE SKIN DAILY, Disp: 15 mL, Rfl: 2 .  Vitamin D, Ergocalciferol, (DRISDOL) 1.25 MG (50000 UT) CAPS capsule, TAKE 1 CAPSULE BY MOUTH 2 TIMES A WEEK, Disp: 24 capsule, Rfl: 0 .  zolpidem (AMBIEN) 10 MG tablet, Take 1 tablet (10 mg total) by mouth at bedtime., Disp: 90 tablet, Rfl: 1 Allergies  Allergen Reactions  . Latex Hives    Assessment & Plan:   1. Other acute sinusitis, recurrence not specified     No orders of the defined types were placed in this encounter.  No orders of the defined types were placed in this encounter.   Arnette Norris, MD 08/20/2019  Time spent with the patient: *** minutes, spent in obtaining information about her symptoms, reviewing her previous labs, evaluations, and treatments, counseling her about her condition (please see the discussed topics above),  and developing a plan to further investigate it; she had a number of questions which I addressed.   [phone] physician/qualified health professional telephone evaluation 5 to 10 minutes [phone] physician/qualified help functional [place] evaluation for 11 to 20 minutes [phone] physician/qualify he will professional telephone evaluation for 21 to 30 minutes

## 2019-08-20 ENCOUNTER — Other Ambulatory Visit: Payer: Self-pay

## 2019-08-20 ENCOUNTER — Ambulatory Visit: Payer: BC Managed Care – PPO | Admitting: Family Medicine

## 2019-08-20 DIAGNOSIS — J019 Acute sinusitis, unspecified: Secondary | ICD-10-CM | POA: Insufficient documentation

## 2019-08-27 ENCOUNTER — Encounter: Payer: Self-pay | Admitting: Family Medicine

## 2019-09-04 ENCOUNTER — Telehealth: Payer: Self-pay

## 2019-09-04 NOTE — Telephone Encounter (Signed)
No it isn't appropriate- let's have her take 2000 IU otc and have her come in for Vit D lab before we decide on dose. It's been 8 months since it was last checked. Thanks!

## 2019-09-04 NOTE — Telephone Encounter (Signed)
Morgan Burgess is at 41/I received request from [facility] for the Vit-Tawnia Schirm 1qwk #12 with 4 refills/is this appropriate?plz advise/thx dmf

## 2019-09-05 DIAGNOSIS — U071 COVID-19: Secondary | ICD-10-CM | POA: Diagnosis not present

## 2019-09-05 DIAGNOSIS — Z20828 Contact with and (suspected) exposure to other viral communicable diseases: Secondary | ICD-10-CM | POA: Diagnosis not present

## 2019-09-06 ENCOUNTER — Encounter: Payer: Self-pay | Admitting: Family Medicine

## 2019-09-09 ENCOUNTER — Telehealth: Payer: Self-pay

## 2019-09-09 MED ORDER — SAXENDA 18 MG/3ML ~~LOC~~ SOPN: 0.6000 mg | PEN_INJECTOR | Freq: Every day | SUBCUTANEOUS | 2 refills | Status: DC

## 2019-09-09 NOTE — Addendum Note (Signed)
Addended by: Rodrigo Ran on: 09/09/2019 08:42 AM   Modules accepted: Orders

## 2019-09-09 NOTE — Telephone Encounter (Signed)
Copied from [place] [id number]. Topic: General - Other >> Sep 09, 2019  7:16 AM Carolyn Stare wrote: Pt said she has been calling about her RX for SAXENDA 18 MG/3ML SOPN, she said the rx is not correct  She said she is injecting 3 mg once a day so there for she need 5 pens and not one pen the pharmacy is trying to give her, please correct and sent to the [place] [address] [phone]

## 2019-09-09 NOTE — Telephone Encounter (Signed)
LMOM for patient to call back for scheduling vitamin D testing.

## 2019-09-10 ENCOUNTER — Encounter: Payer: Self-pay | Admitting: Family Medicine

## 2019-09-10 ENCOUNTER — Other Ambulatory Visit: Payer: Self-pay

## 2019-09-10 MED ORDER — SAXENDA 18 MG/3ML ~~LOC~~ SOPN: 3.0000 mg | PEN_INJECTOR | Freq: Every day | SUBCUTANEOUS | 2 refills | Status: DC

## 2019-09-10 NOTE — Telephone Encounter (Signed)
Mai the pharmacist from   [facility], [place] - [address] AT [address] [phone] (Phone) [phone] (Fax)    Pharmacist states patient stated dose should be 3 mg instead of 0.6 mg for Liraglutide -Weight Management (SAXENDA) please advise.

## 2019-09-10 NOTE — Addendum Note (Signed)
Addended by: Lucille Passy on: 09/10/2019 12:07 PM   Modules accepted: Orders

## 2019-09-10 NOTE — Addendum Note (Signed)
Addended by: Rodrigo Ran on: 09/10/2019 09:26 AM   Modules accepted: Orders

## 2019-09-10 NOTE — Telephone Encounter (Signed)
Thank you.  I have signed new rx.

## 2019-09-10 NOTE — Telephone Encounter (Deleted)
Mai from   [facility], [place] - [address] AT [address] [phone] (Phone) [phone] (Fax)    Pharmacist states patient stated dose should be 3 mg instead of 0.6 mg for  Liraglutide -Weight Management (SAXENDA) please advise.

## 2019-09-25 ENCOUNTER — Other Ambulatory Visit: Payer: Self-pay

## 2019-09-25 DIAGNOSIS — E559 Vitamin D deficiency, unspecified: Secondary | ICD-10-CM

## 2019-09-25 MED ORDER — VITAMIN D (ERGOCALCIFEROL) 1.25 MG (50000 UNIT) PO CAPS: ORAL_CAPSULE | ORAL | 0 refills | Status: DC

## 2019-09-30 ENCOUNTER — Telehealth: Payer: Self-pay | Admitting: Family Medicine

## 2019-09-30 NOTE — Telephone Encounter (Signed)
The pt's plan request the use of alternative medication which is Fluoxetine capsule 20mg . Please call to advise  Pt's reference # [id number]

## 2019-10-01 MED ORDER — FLUOXETINE HCL 20 MG PO CAPS: 20.0000 mg | ORAL_CAPSULE | Freq: Every day | ORAL | 3 refills | Status: DC

## 2019-10-01 NOTE — Telephone Encounter (Signed)
Per TA ok to Bobby Ragan/C Fluoxetine Tabs for Caps/sent to [facility]/thx dmf

## 2019-10-14 ENCOUNTER — Encounter: Payer: Self-pay | Admitting: Family Medicine

## 2019-11-12 ENCOUNTER — Other Ambulatory Visit: Payer: Self-pay

## 2019-11-12 MED ORDER — ZOLPIDEM TARTRATE 10 MG PO TABS: 10.0000 mg | ORAL_TABLET | Freq: Every day | ORAL | 1 refills | Status: DC

## 2019-11-12 NOTE — Telephone Encounter (Signed)
Last OV 08/20/19 Last fill 04/03/19  #90/1

## 2019-11-13 ENCOUNTER — Other Ambulatory Visit: Payer: Self-pay

## 2019-11-13 ENCOUNTER — Other Ambulatory Visit: Payer: Self-pay | Admitting: Family Medicine

## 2019-11-13 MED ORDER — DUEXIS 800-26.6 MG PO TABS: 1.0000 | ORAL_TABLET | Freq: Three times a day (TID) | ORAL | 11 refills | Status: DC

## 2019-11-14 NOTE — Telephone Encounter (Signed)
Last OV 08/20/19 Last fill 07/04/19  #90/0

## 2019-11-15 ENCOUNTER — Encounter: Payer: Self-pay | Admitting: Family Medicine

## 2019-11-15 MED ORDER — AMPHETAMINE-DEXTROAMPHETAMINE 30 MG PO TABS: 15.0000 mg | ORAL_TABLET | Freq: Four times a day (QID) | ORAL | 0 refills | Status: DC

## 2019-11-15 NOTE — Telephone Encounter (Signed)
Quantity changed to #180 and [facility].

## 2019-12-04 ENCOUNTER — Other Ambulatory Visit: Payer: Self-pay

## 2019-12-04 MED ORDER — DUEXIS 800-26.6 MG PO TABS: 1.0000 | ORAL_TABLET | Freq: Three times a day (TID) | ORAL | 3 refills | Status: DC

## 2019-12-06 ENCOUNTER — Telehealth: Payer: Self-pay

## 2019-12-06 NOTE — Telephone Encounter (Signed)
Rx not covered by plan.  Preferred alternative is Ibuprofen, Famotidine.  Please advise.

## 2019-12-06 NOTE — Telephone Encounter (Signed)
Which rx? 

## 2019-12-09 NOTE — Telephone Encounter (Signed)
The Duexis 800-26.6MG  I see you sent in the other,  is that correct?

## 2019-12-10 NOTE — Telephone Encounter (Signed)
I think so.  Do you mind calling the pharmacy to verify?

## 2019-12-10 NOTE — Telephone Encounter (Signed)
Morgan Burgess - PA Case ID: [id number] - Rx #: [id number] Need help? Call us at [phone] Outcome Approvedon December 14 CaseId:[id number];Status:Approved;Review Type:Prior Auth;Coverage Start Date:11/09/2019;Coverage End Date:12/08/2020;

## 2019-12-11 ENCOUNTER — Other Ambulatory Visit: Payer: Self-pay

## 2019-12-11 MED ORDER — DUEXIS 800-26.6 MG PO TABS: 1.0000 | ORAL_TABLET | Freq: Three times a day (TID) | ORAL | 3 refills | Status: DC

## 2019-12-18 NOTE — Progress Notes (Signed)
Virtual Visit via Video   Due to the COVID-19 pandemic, this visit was completed with telemedicine (audio/video) technology to reduce patient and provider exposure as well as to preserve personal protective equipment.   I connected with Morgan Burgess by a video enabled telemedicine application and verified that I am speaking with the correct person using two identifiers. Location patient: Home Location provider: [facility], Office Persons participating in the virtual visit: Morgan Burgess, Arnette Norris, MD   I discussed the limitations of evaluation and management by telemedicine and the availability of in person appointments. The patient expressed understanding and agreed to proceed.  Care Team   Patient Care Team: Lucille Passy, MD as PCP - General (Family Medicine) Higinio Plan, MD as Referring Physician (Obstetrics and Gynecology)  Subjective:   HPI:     Health Maintenance  Topic Date Due  . PAP SMEAR-Modifier  03/22/2018  . INFLUENZA VACCINE  07/27/2019  . TETANUS/TDAP  12/26/2020  . HIV Screening  Discontinued   She has a gynecologist- just had gynecologist visit in 6/20.  Had IUD day at placed at that time.  Review of Systems  Constitutional: Negative for fever and malaise/fatigue.  HENT: Negative for congestion and hearing loss.   Eyes: Negative for blurred vision, discharge and redness.  Respiratory: Negative for cough and shortness of breath.   Cardiovascular: Negative for chest pain, palpitations and leg swelling.  Gastrointestinal: Negative for abdominal pain and heartburn.  Genitourinary: Negative for dysuria.  Musculoskeletal: Negative for falls.  Skin: Negative for rash.  Neurological: Negative for loss of consciousness and headaches.  Endo/Heme/Allergies: Does not bruise/bleed easily.  Psychiatric/Behavioral: Negative for depression.     Patient Active Problem List   Diagnosis Date Noted  . Well woman exam without gynecological exam 12/19/2019   . Binge eating disorder 10/04/2017  . Vitamin D deficiency 10/28/2016  . IDA (iron deficiency anemia) 09/28/2016  . 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 Attention deficit hyperactivity disorder (ADHD) 09/16/2016  . Severe obesity (BMI >= 40) ([place]) 09/16/2016  . Thinning hair 11/28/2014  . Generalized anxiety disorder 11/01/2013  . S/P gastric bypass 11/01/2013  . Metatarsalgia 05/16/2011    Social History   Tobacco Use  . Smoking status: Never Smoker  . Smokeless tobacco: Never Used  Substance Use Topics  . Alcohol use: No    Current Outpatient Medications:  .  amphetamine-dextroamphetamine (ADDERALL) 30 MG tablet, Take 0.5 tablets by mouth 4 (four) times daily., Disp: 180 tablet, Rfl: 0 .  FLUoxetine (PROZAC) 20 MG capsule, Take 1 capsule (20 mg total) by mouth daily., Disp: 90 capsule, Rfl: 3 .  Ibuprofen-Famotidine (DUEXIS) 800-26.6 MG TABS, Take 1 tablet by mouth 3 (three) times daily., Disp: 270 tablet, Rfl: 3 .  Insulin Pen Needle (NOVOFINE) 32G X 6 MM MISC, UAD daily with Saxenda, Disp: 100 each, Rfl: 3 .  levocetirizine (XYZAL) 5 MG tablet, Take 5 mg by mouth every evening., Disp: , Rfl:  .  levonorgestrel (MIRENA) 20 MCG/24HR IUD, 1 each by Intrauterine route continuous., Disp: , Rfl:  .  Liraglutide -Weight Management (SAXENDA) 18 MG/3ML SOPN, Inject 3 mg as directed daily., Disp: 5 pen, Rfl: 2 .  LORazepam (ATIVAN) 0.5 MG tablet, TAKE 1 TABLET BY MOUTH EVERY 8 HOURS AS NEEDED, Disp: 30 tablet, Rfl: 2 .  Olopatadine HCl 0.2 % SOLN, UAD OU BID, Disp: 5 mL, Rfl: 5 .  Vitamin D, Ergocalciferol, (DRISDOL) 1.25 MG (50000 UT) CAPS capsule, TAKE 1 CAPSULE BY MOUTH 2 TIMES  A WEEK, Disp: 24 capsule, Rfl: 0 .  zolpidem (AMBIEN) 10 MG tablet, Take 1 tablet (10 mg total) by mouth at bedtime., Disp: 90 tablet, Rfl: 1  Allergies  Allergen Reactions  . Latex Hives     Objective:  BP 104/68   Pulse (!) 104   Temp 98.2 F (36.8 C)   Ht 5\' 6"  (1.676 m)   Wt 251 lb (113.9 kg)   BMI  40.51 kg/m '  Wt Readings from Last 3 Encounters:  12/19/19 251 lb (113.9 kg)  12/20/18 263 lb 6.4 oz (119.5 kg)  07/18/18 266 lb 6.4 oz (120.8 kg)    VITALS: Per patient if applicable, see vitals. GENERAL: Alert, appears well and in no acute distress. HEENT: Atraumatic, conjunctiva clear, no obvious abnormalities on inspection of external nose and ears. NECK: Normal movements of the head and neck. CARDIOPULMONARY: No increased WOB. Speaking in clear sentences. I:E ratio WNL.  MS: Moves all visible extremities without noticeable abnormality. PSYCH: Pleasant and cooperative, well-groomed. Speech normal rate and rhythm. Affect is appropriate. Insight and judgement are appropriate. Attention is focused, linear, and appropriate.  NEURO: CN grossly intact. Oriented as arrived to appointment on time with no prompting. Moves both UE equally.  SKIN: No obvious lesions, wounds, erythema, or cyanosis noted on face or hands.  Depression screen [facility] 2/9 12/20/2018 10/04/2017 12/22/2016  Decreased Interest 0 0 0  Down, Depressed, Hopeless 0 0 0  PHQ - 2 Score 0 0 0  Altered sleeping - 0 0  Tired, decreased energy - 0 0  Change in appetite - 0 0  Feeling bad or failure about yourself  - 0 0  Trouble concentrating - 0 0  Moving slowly or fidgety/restless - 0 0  Suicidal thoughts - 0 0  PHQ-9 Score - 0 0  Difficult doing work/chores - Not difficult at all -     . COVID-19 Education: The signs and symptoms of COVID-19 were discussed with the patient and how to seek care for testing if needed. The importance of social distancing was discussed today. . Reviewed expectations re: course of current medical issues. . Discussed self-management of symptoms. . Outlined signs and symptoms indicating need for more acute intervention. . Patient verbalized understanding and all questions were answered. Marland Kitchen. Health Maintenance issues including appropriate healthy diet, exercise, and smoking avoidance were  discussed with patient. . See orders for this visit as documented in the electronic medical record.  Ruthe Mannanalia Da Authement, MD   Lab Results  Component Value Date   WBC 6.0 12/17/2018   HGB 12.6 12/17/2018   HCT 36 12/17/2018   PLT 247 12/17/2018   GLUCOSE 84 09/16/2016   CHOL 157 12/17/2018   TRIG 41 12/17/2018   HDL 65 12/17/2018   LDLCALC 84 12/17/2018   ALT 10 12/17/2018   AST 16 12/17/2018   NA 140 12/17/2018   K 4.0 12/17/2018   CL 105 09/16/2016   CREATININE 0.8 12/17/2018   BUN 10 12/17/2018   CO2 29 09/16/2016   TSH 4.23 12/17/2018   HGBA1C 5.0 12/17/2018    Lab Results  Component Value Date   TSH 4.23 12/17/2018   Lab Results  Component Value Date   WBC 6.0 12/17/2018   HGB 12.6 12/17/2018   HCT 36 12/17/2018   MCV 87 10/25/2016   PLT 247 12/17/2018   Lab Results  Component Value Date   NA 140 12/17/2018   K 4.0 12/17/2018   CO2 29 09/16/2016  GLUCOSE 84 09/16/2016   BUN 10 12/17/2018   CREATININE 0.8 12/17/2018   BILITOT 0.5 09/16/2016   ALKPHOS 105 12/17/2018   AST 16 12/17/2018   ALT 10 12/17/2018   PROT 7.0 09/16/2016   ALBUMIN 3.9 09/16/2016   CALCIUM 8.9 09/16/2016   GFR 92.65 09/16/2016   Lab Results  Component Value Date   CHOL 157 12/17/2018   Lab Results  Component Value Date   HDL 65 12/17/2018   Lab Results  Component Value Date   LDLCALC 84 12/17/2018   Lab Results  Component Value Date   TRIG 41 12/17/2018   Lab Results  Component Value Date   CHOLHDL 2 09/16/2016   Lab Results  Component Value Date   HGBA1C 5.0 12/17/2018       Assessment & Plan:   Problem List Items Addressed This Visit      Active Problems   S/P gastric bypass   Attention deficit hyperactivity disorder (ADHD)    She is taking Adderall 30 mg  twice daily. No suspicious activity noted on PMP aware web site. Urine drug screen is up-to-date is not up to date but getting ordered today. Last office visit is up-to-date.        Severe obesity  (BMI >= 40) (HCC)    S/p gastric surgery in 04/2003.  Wt Readings from Last 3 Encounters:  12/19/19 251 lb (113.9 kg)  12/20/18 263 lb 6.4 oz (119.5 kg)  07/18/18 266 lb 6.4 oz (120.8 kg)   Congratulated her on her success.  On saxeda.      Iron malabsorption    Due for labs- CBC with diff, TIBC- sent my chart message with what she needed and she will have it checked at work.      Vitamin D deficiency - Primary    Repeat Vit D. She is taking OTC supplement currently.       Well woman exam without gynecological exam    Reviewed preventive care protocols, scheduled due services, and updated immunizations Discussed nutrition, exercise, diet, and healthy lifestyle.           I am having Morgan Burgess maintain her levonorgestrel, levocetirizine, LORazepam, Olopatadine HCl, Insulin Pen Needle, Saxenda, Vitamin D (Ergocalciferol), zolpidem, amphetamine-dextroamphetamine, Duexis, and FLUoxetine.  Meds ordered this encounter  Medications  . FLUoxetine (PROZAC) 20 MG capsule    Sig: Take 1 capsule (20 mg total) by mouth daily.    Dispense:  90 capsule    Refill:  3    reference # [id number]     Ruthe Mannan, MD

## 2019-12-19 ENCOUNTER — Encounter: Payer: Self-pay | Admitting: Family Medicine

## 2019-12-19 ENCOUNTER — Other Ambulatory Visit: Payer: Self-pay

## 2019-12-19 ENCOUNTER — Ambulatory Visit (INDEPENDENT_AMBULATORY_CARE_PROVIDER_SITE_OTHER): Payer: BC Managed Care – PPO | Admitting: Family Medicine

## 2019-12-19 VITALS — BP 104/68 | HR 104 | Temp 98.2°F | Ht 66.0 in | Wt 251.0 lb

## 2019-12-19 DIAGNOSIS — Z9884 Bariatric surgery status: Secondary | ICD-10-CM

## 2019-12-19 DIAGNOSIS — E559 Vitamin D deficiency, unspecified: Secondary | ICD-10-CM | POA: Diagnosis not present

## 2019-12-19 DIAGNOSIS — K909 Intestinal malabsorption, unspecified: Secondary | ICD-10-CM | POA: Diagnosis not present

## 2019-12-19 DIAGNOSIS — Z79899 Other long term (current) drug therapy: Secondary | ICD-10-CM

## 2019-12-19 DIAGNOSIS — F900 Attention-deficit hyperactivity disorder, predominantly inattentive type: Secondary | ICD-10-CM | POA: Diagnosis not present

## 2019-12-19 DIAGNOSIS — Z0000 Encounter for general adult medical examination without abnormal findings: Secondary | ICD-10-CM | POA: Diagnosis not present

## 2019-12-19 MED ORDER — FLUOXETINE HCL 20 MG PO CAPS: 20.0000 mg | ORAL_CAPSULE | Freq: Every day | ORAL | 3 refills | Status: DC

## 2019-12-19 NOTE — Assessment & Plan Note (Signed)
Repeat Vit D. She is taking OTC supplement currently.

## 2019-12-19 NOTE — Assessment & Plan Note (Signed)
S/p gastric surgery in 04/2003.  Wt Readings from Last 3 Encounters:  12/19/19 251 lb (113.9 kg)  12/20/18 263 lb 6.4 oz (119.5 kg)  07/18/18 266 lb 6.4 oz (120.8 kg)   Congratulated her on her success.  On saxeda.

## 2019-12-19 NOTE — Assessment & Plan Note (Signed)
Reviewed preventive care protocols, scheduled due services, and updated immunizations Discussed nutrition, exercise, diet, and healthy lifestyle.  

## 2019-12-19 NOTE — Assessment & Plan Note (Signed)
Due for labs- CBC with diff, TIBC- sent my chart message with what she needed and she will have it checked at work.

## 2019-12-19 NOTE — Assessment & Plan Note (Addendum)
She is taking Adderall 30 mg  twice daily. No suspicious activity noted on PMP aware web site. Urine drug screen is up-to-date is not up to date but getting ordered today. Last office visit is up-to-date.

## 2019-12-26 DIAGNOSIS — Z79899 Other long term (current) drug therapy: Secondary | ICD-10-CM | POA: Diagnosis not present

## 2019-12-26 DIAGNOSIS — E559 Vitamin D deficiency, unspecified: Secondary | ICD-10-CM | POA: Diagnosis not present

## 2019-12-26 DIAGNOSIS — E782 Mixed hyperlipidemia: Secondary | ICD-10-CM | POA: Diagnosis not present

## 2019-12-26 DIAGNOSIS — Z0000 Encounter for general adult medical examination without abnormal findings: Secondary | ICD-10-CM | POA: Diagnosis not present

## 2019-12-26 DIAGNOSIS — I10 Essential (primary) hypertension: Secondary | ICD-10-CM | POA: Diagnosis not present

## 2019-12-26 DIAGNOSIS — D518 Other vitamin B12 deficiency anemias: Secondary | ICD-10-CM | POA: Diagnosis not present

## 2019-12-26 DIAGNOSIS — E119 Type 2 diabetes mellitus without complications: Secondary | ICD-10-CM | POA: Diagnosis not present

## 2019-12-26 LAB — LIPID PANEL
Cholesterol: 152 (ref 0–200)
HDL: 68 (ref 35–70)
LDL Cholesterol: 75
Triglycerides: 35 — AB (ref 40–160)

## 2019-12-26 LAB — BASIC METABOLIC PANEL
BUN: 10 (ref 4–21)
CO2: 23 — AB (ref 13–22)
Chloride: 1 — AB (ref 99–108)
Creatinine: 0.8 (ref 0.5–1.1)
Glucose: 88
Potassium: 4.2 (ref 3.4–5.3)
Sodium: 138 (ref 137–147)

## 2019-12-26 LAB — IRON,TIBC AND FERRITIN PANEL
Ferritin: 9
Iron: 127
TIBC: 355
UIBC: 228

## 2019-12-26 LAB — HEMOGLOBIN A1C: Hemoglobin A1C: 5.1

## 2019-12-26 LAB — CBC AND DIFFERENTIAL
HCT: 36 (ref 36–46)
Hemoglobin: 11.8 — AB (ref 12.0–16.0)
Neutrophils Absolute: 2
Platelets: 223 (ref 150–399)
WBC: 3.9

## 2019-12-26 LAB — VITAMIN D 25 HYDROXY (VIT D DEFICIENCY, FRACTURES): Vit D, 25-Hydroxy: 41.3

## 2019-12-26 LAB — HEPATIC FUNCTION PANEL
ALT: 7 (ref 7–35)
AST: 14 (ref 13–35)
Alkaline Phosphatase: 104 (ref 25–125)
Bilirubin, Total: 0.6

## 2019-12-26 LAB — COMPREHENSIVE METABOLIC PANEL
Albumin: 4.3 (ref 3.5–5.0)
Calcium: 8.9 (ref 8.7–10.7)
Globulin: 2.5

## 2019-12-26 LAB — TSH: TSH: 1.41 (ref 0.41–5.90)

## 2019-12-26 LAB — VITAMIN B12: Vitamin B-12: 302

## 2019-12-26 LAB — CBC: RBC: 4.03 (ref 3.87–5.11)

## 2019-12-26 NOTE — Telephone Encounter (Signed)
Yes of course, long term use of stimulant, Z79.899.

## 2019-12-31 ENCOUNTER — Encounter: Payer: Self-pay | Admitting: Family Medicine

## 2019-12-31 NOTE — Telephone Encounter (Signed)
Please see other encounter.

## 2019-12-31 NOTE — Telephone Encounter (Addendum)
Can you help me with this?  I called patient to try and help her get exactly what was needed.  Pt informed me that it was too late because the urine is not longer good, (past 7 days).  Pt would like to have a new order placed for the UDS.  She will print off order and her chart and take it to work with her to get lab performed there. Please place lab in future. Please advise.

## 2019-12-31 NOTE — Telephone Encounter (Signed)
Patient is returning the call. CB is [phone]

## 2020-01-01 ENCOUNTER — Other Ambulatory Visit: Payer: Self-pay | Admitting: Family Medicine

## 2020-01-01 DIAGNOSIS — Z79899 Other long term (current) drug therapy: Secondary | ICD-10-CM

## 2020-01-02 ENCOUNTER — Encounter: Payer: Self-pay | Admitting: Family Medicine

## 2020-01-03 NOTE — Telephone Encounter (Signed)
Please see message and advise.  Thank you. ° °

## 2020-01-08 DIAGNOSIS — U071 COVID-19: Secondary | ICD-10-CM | POA: Diagnosis not present

## 2020-01-08 DIAGNOSIS — Z20828 Contact with and (suspected) exposure to other viral communicable diseases: Secondary | ICD-10-CM | POA: Diagnosis not present

## 2020-01-15 NOTE — Telephone Encounter (Signed)
Patient is calling back regarding previous message left. CB [phone]

## 2020-01-16 ENCOUNTER — Other Ambulatory Visit: Payer: Self-pay

## 2020-01-16 NOTE — Progress Notes (Signed)
[facility]/thx dmf 

## 2020-01-22 ENCOUNTER — Encounter: Payer: Self-pay | Admitting: Family Medicine

## 2020-01-28 ENCOUNTER — Encounter: Payer: Self-pay | Admitting: Family Medicine

## 2020-01-28 ENCOUNTER — Telehealth: Payer: Self-pay

## 2020-01-28 ENCOUNTER — Other Ambulatory Visit: Payer: Self-pay

## 2020-01-28 DIAGNOSIS — K909 Intestinal malabsorption, unspecified: Secondary | ICD-10-CM

## 2020-01-28 MED ORDER — CYANOCOBALAMIN 1000 MCG/ML IJ SOLN: INTRAMUSCULAR | 3 refills | Status: DC

## 2020-01-28 MED ORDER — SAXENDA 18 MG/3ML ~~LOC~~ SOPN: 3.0000 mg | PEN_INJECTOR | Freq: Every day | SUBCUTANEOUS | 3 refills | Status: DC

## 2020-01-28 NOTE — Telephone Encounter (Signed)
Sent a MyChart message to the patient advising this/thx dmf

## 2020-01-28 NOTE — Telephone Encounter (Signed)
Referral to hematology, who gives these infusions, placed.

## 2020-01-28 NOTE — Telephone Encounter (Signed)
TA-Can you please place referral for [facility]? Thx dmf

## 2020-01-30 DIAGNOSIS — Z20828 Contact with and (suspected) exposure to other viral communicable diseases: Secondary | ICD-10-CM | POA: Diagnosis not present

## 2020-01-30 DIAGNOSIS — J101 Influenza due to other identified influenza virus with other respiratory manifestations: Secondary | ICD-10-CM | POA: Diagnosis not present

## 2020-01-30 DIAGNOSIS — U071 COVID-19: Secondary | ICD-10-CM | POA: Diagnosis not present

## 2020-01-30 DIAGNOSIS — B974 Respiratory syncytial virus as the cause of diseases classified elsewhere: Secondary | ICD-10-CM | POA: Diagnosis not present

## 2020-02-04 ENCOUNTER — Other Ambulatory Visit: Payer: Self-pay | Admitting: Family Medicine

## 2020-02-04 DIAGNOSIS — E559 Vitamin D deficiency, unspecified: Secondary | ICD-10-CM

## 2020-02-06 ENCOUNTER — Other Ambulatory Visit: Payer: Self-pay | Admitting: Family

## 2020-02-06 DIAGNOSIS — D508 Other iron deficiency anemias: Secondary | ICD-10-CM

## 2020-02-06 DIAGNOSIS — K909 Intestinal malabsorption, unspecified: Secondary | ICD-10-CM

## 2020-02-07 ENCOUNTER — Encounter: Payer: Self-pay | Admitting: Family

## 2020-02-07 ENCOUNTER — Other Ambulatory Visit: Payer: Self-pay

## 2020-02-07 ENCOUNTER — Inpatient Hospital Stay: Payer: BC Managed Care – PPO | Attending: Family | Admitting: Family

## 2020-02-07 ENCOUNTER — Inpatient Hospital Stay: Payer: BC Managed Care – PPO

## 2020-02-07 ENCOUNTER — Other Ambulatory Visit: Payer: Self-pay | Admitting: Family

## 2020-02-07 VITALS — BP 131/72 | HR 85 | Temp 97.2°F | Resp 19 | Ht 67.0 in | Wt 268.8 lb

## 2020-02-07 DIAGNOSIS — Z9884 Bariatric surgery status: Secondary | ICD-10-CM | POA: Insufficient documentation

## 2020-02-07 DIAGNOSIS — D508 Other iron deficiency anemias: Secondary | ICD-10-CM | POA: Diagnosis not present

## 2020-02-07 DIAGNOSIS — D509 Iron deficiency anemia, unspecified: Secondary | ICD-10-CM | POA: Diagnosis not present

## 2020-02-07 DIAGNOSIS — K909 Intestinal malabsorption, unspecified: Secondary | ICD-10-CM | POA: Insufficient documentation

## 2020-02-07 NOTE — Progress Notes (Signed)
Hematology and Oncology Follow Up Visit  Morgan Burgess [id number] January 29, 1981 39 y.o. 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 She was last seen in 2017 and received 2 doses of Feraheme. She did have a rash and SOB after her last dose and went to the ED for treatment. We will make sure to premedicate prior to any future infusions.  Her ferritin several weeks ago was down to 9. Hgb 11.8.  She has had some intermittent fatigue and ice cravings.  She has an IUD in place with no cycle or spotting. When she had this changed last year she states that she had bleeding for 2 weeks after.  No other issues with blood loss, no bruising or petechiae.  No fever, chills, n/v, cough, rash, dizziness, SOB, chest pain, palpitations, abdominal pain or changes in bowel or bladder habits.  No swelling, tenderness, numbness or tingling in her extremities.  No falls or syncope.  She has maintained a good appetite and is staying well hydrated. Weight is stable.   ECOG Performance Status: 1 - Symptomatic but completely ambulatory  Medications:  Allergies as of 02/07/2020      Reactions   Latex Hives      Medication List       Accurate as of February 07, 2020 11:38 AM. If you have any questions, ask your nurse or doctor.        amphetamine-dextroamphetamine 30 MG tablet Commonly known as: Adderall Take 0.5 tablets by mouth 4 (four) times daily.   cyanocobalamin 1000 MCG/ML injection Commonly known as: (VITAMIN B-12) Injext SQ 39mL q2 weeks   Duexis 800-26.6 MG Tabs Generic drug: Ibuprofen-Famotidine Take 1 tablet by mouth 3 (three) times daily.   FLUoxetine 20 MG capsule Commonly known as: PROzac Take 1 capsule (20 mg total) by mouth daily.   Insulin Pen Needle 32G X 6 MM Misc Commonly known as: NovoFine UAD daily  with Saxenda   levocetirizine 5 MG tablet Commonly known as: XYZAL Take 5 mg by mouth every evening.   levonorgestrel 20 MCG/24HR IUD Commonly known as: MIRENA 1 each by Intrauterine route continuous.   LORazepam 0.5 MG tablet Commonly known as: ATIVAN TAKE 1 TABLET BY MOUTH EVERY 8 HOURS AS NEEDED   Olopatadine HCl 0.2 % Soln UAD OU BID   Saxenda 18 MG/3ML Sopn Generic drug: Liraglutide -Weight Management Inject 3 mg as directed daily.   Vitamin D (Ergocalciferol) 1.25 MG (50000 UNIT) Caps capsule Commonly known as: DRISDOL TAKE 1 CAPSULE TWO TIMES A WEEK   zolpidem 10 MG tablet Commonly known as: AMBIEN Take 1 tablet (10 mg total) by mouth at bedtime.       Allergies:  Allergies  Allergen Reactions  . Latex Hives    Past Medical History, Surgical history, Social history, and Family History were reviewed and updated.  Review of Systems: All other 10 point review of systems is negative.   Physical Exam:  vitals were not taken for this visit.   Wt Readings from Last 3 Encounters:  12/19/19 251 lb (113.9 kg)  12/20/18 263 lb 6.4 oz (119.5 kg)  07/18/18 266 lb 6.4 oz (120.8 kg)    Ocular: Sclerae unicteric, pupils equal, round and reactive to light Ear-nose-throat: Oropharynx clear, dentition fair Lymphatic: No cervical or supraclavicular adenopathy Lungs no rales or  rhonchi, good excursion bilaterally Heart regular rate and rhythm, no murmur appreciated Abd soft, nontender, positive bowel sounds, no liver or spleen tip palpated on exam, no fluid wave  MSK no focal spinal tenderness, no joint edema Neuro: non-focal, well-oriented, appropriate affect Breasts: Deferred   Lab Results  Component Value Date   WBC 3.9 12/26/2019   HGB 11.8 (A) 12/26/2019   HCT 36 12/26/2019   MCV 87 10/25/2016   PLT 223 12/26/2019   Lab Results  Component Value Date   FERRITIN 9 12/26/2019   IRON 127 12/26/2019   TIBC 355 12/26/2019   UIBC 228 12/26/2019   IRONPCTSAT  18 12/17/2018   Lab Results  Component Value Date   RBC 4.03 12/26/2019   No results found for: KPAFRELGTCHN, LAMBDASER, KAPLAMBRATIO No results found for: IGGSERUM, IGA, IGMSERUM No results found for: Dorene Ar, A1GS, A2GS, Evans Lance, GAMS, MSPIKE, SPEI   Chemistry      Component Value Date/Time   NA 138 12/26/2019 0000   K 4.2 12/26/2019 0000   CL 1 (A) 12/26/2019 0000   CO2 23 (A) 12/26/2019 0000   BUN 10 12/26/2019 0000   CREATININE 0.8 12/26/2019 0000   CREATININE 0.75 09/16/2016 0924   GLU 88 12/26/2019 0000      Component Value Date/Time   CALCIUM 8.9 12/26/2019 0000   ALKPHOS 104 12/26/2019 0000   AST 14 12/26/2019 0000   ALT 7 12/26/2019 0000   BILITOT 0.5 09/16/2016 9622       Impression and Plan: Morgan Burgess is a pleasant 39 yo caucasian female with iron deficiency anemia secondary to malabsorption after gastric bypass (2004).  Due to her high deductible she is considering having her IV iron infusions in an outpatient clinic. I filled out the paperwork she brought in and gave to Delice Bison to fax in.  She also has orders here per her request for Iv iron next week incase after speaking with her insurance company this facility is cheaper.  Venofer orders are in as well as premeds (benadryl and solumedrol) for 3 doses.  We will repeat lab work in 8 weeks. Also, due to cost, she prefers these to be done where she works so she was given a script for CBC, Ferritin and TIBC.  We will plan to see her annually for follow-up.  She will contact our office with any questions or concerns. We can certainly see her sooner if needed.   Emeline Gins, NP 2/12/202111:38 AM

## 2020-02-12 ENCOUNTER — Telehealth: Payer: Self-pay | Admitting: *Deleted

## 2020-02-12 NOTE — Telephone Encounter (Signed)
Received a call from Joe in [facility] who states patient would prefer to get her injections every 7 days instead of every 3 days.  Gave verbal ok to do this.

## 2020-02-14 ENCOUNTER — Inpatient Hospital Stay: Payer: BC Managed Care – PPO

## 2020-02-17 DIAGNOSIS — Z20828 Contact with and (suspected) exposure to other viral communicable diseases: Secondary | ICD-10-CM | POA: Diagnosis not present

## 2020-02-17 DIAGNOSIS — U071 COVID-19: Secondary | ICD-10-CM | POA: Diagnosis not present

## 2020-02-18 ENCOUNTER — Encounter: Payer: Self-pay | Admitting: Family

## 2020-02-20 DIAGNOSIS — D509 Iron deficiency anemia, unspecified: Secondary | ICD-10-CM | POA: Diagnosis not present

## 2020-02-20 DIAGNOSIS — K909 Intestinal malabsorption, unspecified: Secondary | ICD-10-CM | POA: Diagnosis not present

## 2020-02-21 ENCOUNTER — Other Ambulatory Visit: Payer: Self-pay | Admitting: Family

## 2020-02-21 ENCOUNTER — Telehealth: Payer: Self-pay | Admitting: *Deleted

## 2020-02-21 NOTE — Telephone Encounter (Signed)
Call received from [facility] questioning a prescription that was sent today for Infed when Venofer was given yesterday.  Vito Berger states that if Infed is needed, the pharmacy at [facility] is questioning if dose of Infed needs to be decreased since Venofer was given yesterday.  Ethel Rana NP notified and order received for pt to continue getting Venofer and not Infed.  Vito Berger states that she will refax order for Venofer.

## 2020-02-26 DIAGNOSIS — D509 Iron deficiency anemia, unspecified: Secondary | ICD-10-CM | POA: Diagnosis not present

## 2020-02-26 DIAGNOSIS — K909 Intestinal malabsorption, unspecified: Secondary | ICD-10-CM | POA: Diagnosis not present

## 2020-05-05 ENCOUNTER — Other Ambulatory Visit: Payer: Self-pay

## 2020-05-05 NOTE — Telephone Encounter (Signed)
[facility]-Rx requested for [place] [facility] for Zolpidem/prepared and pended with note stating "Plz sched appt with new provider for future fills"/plz advise/thx dmf

## 2020-05-06 MED ORDER — ZOLPIDEM TARTRATE 10 MG PO TABS: ORAL_TABLET | ORAL | 0 refills | Status: DC

## 2020-05-08 ENCOUNTER — Telehealth: Payer: Self-pay | Admitting: General Practice

## 2020-05-08 MED ORDER — AMPHETAMINE-DEXTROAMPHETAMINE 30 MG PO TABS: 15.0000 mg | ORAL_TABLET | Freq: Four times a day (QID) | ORAL | 0 refills | Status: DC

## 2020-05-08 NOTE — Telephone Encounter (Signed)
Last OV 12/19/19 Last fill 11/15/19  #180/0

## 2020-05-08 NOTE — Telephone Encounter (Signed)
Patient is calling and requesting a refill for Adderall sent to [facility]. Patient is going to call back to set up [facility] appointment with Dr. Salena Saner. CB is [phone]

## 2020-05-08 NOTE — Telephone Encounter (Signed)
Refilled x 1mo.

## 2020-05-20 DIAGNOSIS — Z23 Encounter for immunization: Secondary | ICD-10-CM | POA: Diagnosis not present

## 2020-08-09 ENCOUNTER — Other Ambulatory Visit: Payer: Self-pay | Admitting: Family Medicine

## 2020-08-18 NOTE — Telephone Encounter (Signed)
Medication refill request  Refill request declined at this time. Previous patient of Dr. Elmer Sow. Patient needs to establish care with another provider to obtain further refills. Please see last refill note.

## 2020-08-28 ENCOUNTER — Telehealth (INDEPENDENT_AMBULATORY_CARE_PROVIDER_SITE_OTHER): Payer: BC Managed Care – PPO | Admitting: Family Medicine

## 2020-08-28 ENCOUNTER — Encounter: Payer: Self-pay | Admitting: Family Medicine

## 2020-08-28 VITALS — BP 104/62 | HR 98 | Resp 20 | Ht 67.0 in | Wt 274.0 lb

## 2020-08-28 DIAGNOSIS — F900 Attention-deficit hyperactivity disorder, predominantly inattentive type: Secondary | ICD-10-CM

## 2020-08-28 DIAGNOSIS — Z9884 Bariatric surgery status: Secondary | ICD-10-CM | POA: Diagnosis not present

## 2020-08-28 DIAGNOSIS — G4700 Insomnia, unspecified: Secondary | ICD-10-CM | POA: Diagnosis not present

## 2020-08-28 MED ORDER — ZOLPIDEM TARTRATE 10 MG PO TABS: ORAL_TABLET | ORAL | 0 refills | Status: DC

## 2020-08-28 MED ORDER — AMPHETAMINE-DEXTROAMPHETAMINE 30 MG PO TABS: 15.0000 mg | ORAL_TABLET | Freq: Four times a day (QID) | ORAL | 0 refills | Status: DC

## 2020-08-28 MED ORDER — SEMAGLUTIDE-WEIGHT MANAGEMENT 0.25 MG/0.5ML ~~LOC~~ SOAJ: 0.2500 mg | SUBCUTANEOUS | 0 refills | Status: DC

## 2020-08-28 NOTE — Progress Notes (Signed)
Virtual Visit via Video Note  I connected with Morgan Burgess on 08/28/20 at  3:30 PM EDT by a video enabled telemedicine application and verified that I am speaking with the correct person using two identifiers. Location patient: home Location provider: work Persons participating in the virtual visit: patient, provider  I discussed the limitations of evaluation and management by telemedicine and the availability of in person appointments. The patient expressed understanding and agreed to proceed.  Chief Complaint  Patient presents with   Follow-up    med refills-Adderall an Ambien [facility] and needs a 90 day supply for these-but out of [place] and wanted to see if she could get a 30 days to local pharmacy//pt also wanted to discuss switching from [place] to [facility] or the new one that is once weekly-link sent     HPI: Morgan Burgess is a 39 y.o. female who is a former pt of Dr. Dayton Martes who is seen today for f/u on ADHD, insomnia as well as medication refills. She is stable on current meds/doses and has been for some time. No side effects. Sleep and appetite are good. She usually has meds sent to [place] [facility] w 90 day supply but due to some confusion with refills requests a 30 day supply be sent locally and then 90 day sent to [place] [facility].  She has been using saxenda x 8 mo but does not take consistently and experiences side effects. On adipex in the past. Pt had gastric bypass in 2004. She would like to try ozempic.    Past Medical History:  Diagnosis Date   Depression     Past Surgical History:  Procedure Laterality Date   CHOLECYSTECTOMY  2004   DILATION AND CURETTAGE OF UTERUS  2012   GASTRIC BYPASS  2004    Family History  Problem Relation Age of Onset   Hypertension Mother    Hypertension Father    Diabetes Neg Hx    Heart attack Neg Hx     Social History   Tobacco Use   Smoking status: Never Smoker   Smokeless tobacco: Never Used  Vaping  Use   Vaping Use: Never used  Substance Use Topics   Alcohol use: No   Drug use: No     Current Outpatient Medications:    amphetamine-dextroamphetamine (ADDERALL) 30 MG tablet, Take 0.5 tablets by mouth 4 (four) times daily., Disp: 60 tablet, Rfl: 0   cyanocobalamin (,VITAMIN B-12,) 1000 MCG/ML injection, Injext SQ 37mL q2 weeks, Disp: 10 mL, Rfl: 3   FLUoxetine (PROZAC) 20 MG capsule, Take 1 capsule (20 mg total) by mouth daily., Disp: 90 capsule, Rfl: 3   Ibuprofen-Famotidine (DUEXIS) 800-26.6 MG TABS, Take 1 tablet by mouth 3 (three) times daily., Disp: 270 tablet, Rfl: 3   Insulin Pen Needle (NOVOFINE) 32G X 6 MM MISC, UAD daily with Saxenda, Disp: 100 each, Rfl: 3   levocetirizine (XYZAL) 5 MG tablet, Take 5 mg by mouth every evening., Disp: , Rfl:    levonorgestrel (MIRENA) 20 MCG/24HR IUD, 1 each by Intrauterine route continuous., Disp: , Rfl:    Liraglutide -Weight Management (SAXENDA) 18 MG/3ML SOPN, Inject 3 mg as directed daily., Disp: 5 pen, Rfl: 3   LORazepam (ATIVAN) 0.5 MG tablet, TAKE 1 TABLET BY MOUTH EVERY 8 HOURS AS NEEDED, Disp: 30 tablet, Rfl: 2   Olopatadine HCl 0.2 % SOLN, UAD OU BID, Disp: 5 mL, Rfl: 5   Vitamin D, Ergocalciferol, (DRISDOL) 1.25 MG (50000  UNIT) CAPS capsule, TAKE 1 CAPSULE TWO TIMES A WEEK, Disp: 24 capsule, Rfl: 3   zolpidem (AMBIEN) 10 MG tablet, Take 1qhs (Plz sched appt with new provider for future fills), Disp: 90 tablet, Rfl: 0  Allergies  Allergen Reactions   Latex Hives      ROS: See pertinent positives and negatives per HPI.   EXAM:  VITALS per patient if applicable: BP 104/62 Comment: pt reported   Pulse 98 Comment: pt reported   Resp 20 Comment: pt reported   Ht 5\' 7"  (1.702 m) Comment: pt reported   Wt 274 lb (124.3 kg) Comment: pt reported   SpO2 100% Comment: pt reported   BMI 42.91 kg/m    GENERAL: alert, oriented, in no acute distress  NECK: normal movements of the head and neck  LUNGS: on inspection no  signs of respiratory distress, breathing rate appears normal, no obvious gross SOB, gasping or wheezing, no conversational dyspnea  CV: no obvious cyanosis  PSYCH/NEURO: pleasant and cooperative, no obvious depression or anxiety, speech and thought processing grossly intact   ASSESSMENT AND PLAN:  1. S/P gastric bypass 2. Severe obesity (BMI >= 40) (HCC) - d/c saxenda Rx: - Semaglutide-Weight Management 0.25 MG/0.5ML SOAJ; Inject 0.5 mLs (0.25 mg total) into the skin once a week for 4 doses.  Dispense: 2 mL; Refill: 0  3. Insomnia, unspecified type - stable, controlled - database reviewed and appropriate - due for UDS Refill: - zolpidem (AMBIEN) 10 MG tablet; Take 1qhs  Dispense: 90 tablet; Refill: 0 - f/u in 3 mo or sooner PRN  4. Attention deficit hyperactivity disorder (ADHD), predominantly inattentive type - stable, controlled - database reviewed and appropriate - due for UDS Refill: - amphetamine-dextroamphetamine (ADDERALL) 30 MG tablet; Take 0.5 tablets by mouth 4 (four) times daily.  Dispense: 180 tablet; Refill: 0 - f/u in 3 mo or sooner PRN    I discussed the assessment and treatment plan with the patient. The patient was provided an opportunity to ask questions and all were answered. The patient agreed with the plan and demonstrated an understanding of the instructions.   The patient was advised to call back or seek an in-person evaluation if the symptoms worsen or if the condition fails to improve as anticipated.   , DO

## 2020-09-03 ENCOUNTER — Telehealth: Payer: Self-pay

## 2020-09-03 ENCOUNTER — Encounter: Payer: Self-pay | Admitting: Family Medicine

## 2020-09-03 NOTE — Telephone Encounter (Signed)
PA for Wegovy 0.25mg /0.42ml injectors approved through cover my meds Key: [id number], from 08/04/20 - 04/01/21. Pharmacy/patient notified VIA phone. DM/CMA

## 2020-09-23 ENCOUNTER — Telehealth: Payer: Self-pay | Admitting: Family Medicine

## 2020-09-23 NOTE — Telephone Encounter (Signed)
Patient is asking if her visit on 12/04/20 for a physical and wants to know if it can be a my chart VV.  She is also asking about some lab work from last OV.  Please review and advise.  Thanks.  Dm/cma

## 2020-09-23 NOTE — Telephone Encounter (Signed)
CPE appt cannot be done virtually as I need to perform a physical exam. Labs can be done at the time of her CPE appt

## 2020-09-23 NOTE — Telephone Encounter (Signed)
Patient is scheduled on 12/10 for TOC/Physical. We have to reschedule due to provider being out of the office. She states that her acute visit via MyChart on 09/03 was her [facility] appointment so this should just be her physical. She also wants to know if she can do a MyChart visit for her Physical. Please advise if this is okay. 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   Patients contact info: [phone]

## 2020-09-24 ENCOUNTER — Encounter: Payer: Self-pay | Admitting: Family Medicine

## 2020-09-24 NOTE — Telephone Encounter (Signed)
Patient notified VIA phone and will want her labs to be done at another place so will need orders printed at her OV.  No further questions.  Dm/cma

## 2020-10-15 ENCOUNTER — Other Ambulatory Visit: Payer: Self-pay | Admitting: Family Medicine

## 2020-10-15 DIAGNOSIS — Z9884 Bariatric surgery status: Secondary | ICD-10-CM

## 2020-10-29 ENCOUNTER — Telehealth: Payer: Self-pay | Admitting: Family Medicine

## 2020-10-29 DIAGNOSIS — Z9884 Bariatric surgery status: Secondary | ICD-10-CM

## 2020-10-29 MED ORDER — WEGOVY 0.25 MG/0.5ML ~~LOC~~ SOAJ: 0.2500 mg | SUBCUTANEOUS | 3 refills | Status: DC

## 2020-10-29 NOTE — Telephone Encounter (Signed)
[facility]

## 2020-10-29 NOTE — Telephone Encounter (Signed)
Spoke with patient who states that her insurance will no longer cover requested medication at [facility] or [facility] last video visit 08/28/20 patient picked up Rx on 09/25/20 due to pick up next injection but she [facility] out of pocket cost is $1500 patient would [facility]. Please advise.   WEGOVY 0.25 MG/0.5ML SOAJ Corrie Dandy Cirigliano, DO]   Patient Comment: This needs to go to my mail order the local pharmacies will not do a three month supply on the smart swell program so Im unable to pick up the prescription if it could please go to [facility]. Been fighting with the pharmacies to get it [facility] get [facility] filled because it was on back order and now that theyve got it in I cant get it filled called [facility] and they said it Orest Dikes to go to them because its part of a smart swel program   Preferred pharmacy: [facility] - [place], [place] - [address]

## 2020-10-29 NOTE — Telephone Encounter (Signed)
Patient aware.

## 2020-11-02 NOTE — Telephone Encounter (Signed)
Can you please check CoverMy meds for pt's PA?  Thank you.

## 2020-11-04 NOTE — Telephone Encounter (Signed)
Called patient to discuss the [facility] Rx that was [facility].  Please review message belwo and advise.   Also received a refill request for:  Fluoxetine 20 mg for mail order.   LR 12/19/19 , #9, 3 rf's.  She will have enough to make it to her appointment but don't want to run  due to the holidays and it takes 10-12 days for them to get the RX mailed to her.    Please advise.   Thanks.   Dm/cma

## 2020-11-05 MED ORDER — WEGOVY 0.5 MG/0.5ML ~~LOC~~ SOAJ: 0.5000 mg | SUBCUTANEOUS | 0 refills | Status: AC

## 2020-11-05 MED ORDER — WEGOVY 2.4 MG/0.75ML ~~LOC~~ SOAJ: 2.4000 mg | SUBCUTANEOUS | 5 refills | Status: DC

## 2020-11-05 MED ORDER — WEGOVY 0.25 MG/0.5ML ~~LOC~~ SOAJ: 0.2500 mg | SUBCUTANEOUS | 0 refills | Status: AC

## 2020-11-05 MED ORDER — WEGOVY 1 MG/0.5ML ~~LOC~~ SOAJ: 1.0000 mg | SUBCUTANEOUS | 0 refills | Status: AC

## 2020-11-05 MED ORDER — WEGOVY 1.7 MG/0.75ML ~~LOC~~ SOAJ: 1.7000 mg | SUBCUTANEOUS | 0 refills | Status: AC

## 2020-11-05 MED ORDER — FLUOXETINE HCL 20 MG PO CAPS: 20.0000 mg | ORAL_CAPSULE | Freq: Every day | ORAL | 3 refills | Status: DC

## 2020-11-05 NOTE — Telephone Encounter (Signed)
I refilled pts prozac. I sent to local pharmacy each of the 4 4wk wegovy titration Rxs (0.25, 0.5, 1.0, 1.7mg ) and then sent to mail order pharm the maintenance dose of 2.4mg .

## 2020-11-05 NOTE — Telephone Encounter (Signed)
Please call pt to make her aware

## 2020-11-05 NOTE — Addendum Note (Signed)
Addended by: Overton Mam on: 11/05/2020 07:58 AM   Modules accepted: Orders

## 2020-11-05 NOTE — Telephone Encounter (Signed)
patient notified VIA phone.  No further questions.  Dm/cma ° °

## 2020-11-10 NOTE — Telephone Encounter (Signed)
Your prior authorization for Reginal Lutes has been approved!  For medication WEGOVY

## 2020-11-27 ENCOUNTER — Other Ambulatory Visit: Payer: Self-pay | Admitting: Family Medicine

## 2020-11-27 DIAGNOSIS — G4700 Insomnia, unspecified: Secondary | ICD-10-CM

## 2020-11-28 ENCOUNTER — Other Ambulatory Visit: Payer: Self-pay | Admitting: Family Medicine

## 2020-11-28 DIAGNOSIS — F900 Attention-deficit hyperactivity disorder, predominantly inattentive type: Secondary | ICD-10-CM

## 2020-11-28 NOTE — Telephone Encounter (Signed)
Last VV 08/28/20 Last fill 08/28/20 #90/0

## 2020-11-30 NOTE — Telephone Encounter (Signed)
Last VV 08/28/20 Last fill 08/28/20  #180/0

## 2020-12-01 ENCOUNTER — Other Ambulatory Visit: Payer: Self-pay | Admitting: Family Medicine

## 2020-12-01 DIAGNOSIS — F900 Attention-deficit hyperactivity disorder, predominantly inattentive type: Secondary | ICD-10-CM

## 2020-12-02 MED ORDER — AMPHETAMINE-DEXTROAMPHETAMINE 30 MG PO TABS: 15.0000 mg | ORAL_TABLET | Freq: Four times a day (QID) | ORAL | 0 refills | Status: DC

## 2020-12-02 NOTE — Telephone Encounter (Signed)
Duplicate, last filled on 12/02/20

## 2020-12-04 ENCOUNTER — Encounter: Payer: BC Managed Care – PPO | Admitting: Family Medicine

## 2020-12-16 ENCOUNTER — Encounter: Payer: Self-pay | Admitting: Family Medicine

## 2020-12-16 ENCOUNTER — Ambulatory Visit (INDEPENDENT_AMBULATORY_CARE_PROVIDER_SITE_OTHER): Payer: BC Managed Care – PPO | Admitting: Family Medicine

## 2020-12-16 ENCOUNTER — Other Ambulatory Visit: Payer: Self-pay

## 2020-12-16 VITALS — BP 118/74 | HR 100 | Temp 97.6°F | Ht 66.75 in | Wt 263.2 lb

## 2020-12-16 DIAGNOSIS — E538 Deficiency of other specified B group vitamins: Secondary | ICD-10-CM | POA: Diagnosis not present

## 2020-12-16 DIAGNOSIS — Z6841 Body Mass Index (BMI) 40.0 and over, adult: Secondary | ICD-10-CM

## 2020-12-16 DIAGNOSIS — Z9884 Bariatric surgery status: Secondary | ICD-10-CM

## 2020-12-16 DIAGNOSIS — D508 Other iron deficiency anemias: Secondary | ICD-10-CM | POA: Diagnosis not present

## 2020-12-16 DIAGNOSIS — Z0000 Encounter for general adult medical examination without abnormal findings: Secondary | ICD-10-CM

## 2020-12-16 DIAGNOSIS — Z2821 Immunization not carried out because of patient refusal: Secondary | ICD-10-CM

## 2020-12-16 DIAGNOSIS — E559 Vitamin D deficiency, unspecified: Secondary | ICD-10-CM | POA: Diagnosis not present

## 2020-12-16 DIAGNOSIS — Z13220 Encounter for screening for lipoid disorders: Secondary | ICD-10-CM

## 2020-12-16 DIAGNOSIS — Z1231 Encounter for screening mammogram for malignant neoplasm of breast: Secondary | ICD-10-CM

## 2020-12-16 LAB — LIPID PANEL
Cholesterol: 141 mg/dL (ref 0–200)
HDL: 62.6 mg/dL (ref >39.00)
LDL Cholesterol: 69 mg/dL (ref 0–99)
NonHDL: 77.94
Total CHOL/HDL Ratio: 2
Triglycerides: 43 mg/dL (ref 0.0–149.0)
VLDL: 8.6 mg/dL (ref 0.0–40.0)

## 2020-12-16 LAB — COMPREHENSIVE METABOLIC PANEL
ALT: 7 U/L (ref 0–35)
AST: 11 U/L (ref 0–37)
Albumin: 4.4 g/dL (ref 3.5–5.2)
Alkaline Phosphatase: 80 U/L (ref 39–117)
BUN: 12 mg/dL (ref 6–23)
CO2: 29 mEq/L (ref 19–32)
Calcium: 9.4 mg/dL (ref 8.4–10.5)
Chloride: 103 mEq/L (ref 96–112)
Creatinine, Ser: 0.79 mg/dL (ref 0.40–1.20)
GFR: 93.32 mL/min (ref >60.00)
Glucose, Bld: 90 mg/dL (ref 70–99)
Potassium: 4.8 mEq/L (ref 3.5–5.1)
Sodium: 138 mEq/L (ref 135–145)
Total Bilirubin: 0.8 mg/dL (ref 0.2–1.2)
Total Protein: 7.1 g/dL (ref 6.0–8.3)

## 2020-12-16 LAB — CBC
HCT: 39.4 % (ref 36.0–46.0)
Hemoglobin: 13.4 g/dL (ref 12.0–15.0)
MCHC: 34.1 g/dL (ref 30.0–36.0)
MCV: 90.3 fl (ref 78.0–100.0)
Platelets: 193 10*3/uL (ref 150.0–400.0)
RBC: 4.36 Mil/uL (ref 3.87–5.11)
RDW: 12.8 % (ref 11.5–15.5)
WBC: 4.7 10*3/uL (ref 4.0–10.5)

## 2020-12-16 LAB — T4, FREE: Free T4: 0.74 ng/dL (ref 0.60–1.60)

## 2020-12-16 LAB — VITAMIN D 25 HYDROXY (VIT D DEFICIENCY, FRACTURES): VITD: 75.39 ng/mL (ref 30.00–100.00)

## 2020-12-16 LAB — TSH: TSH: 1.13 u[IU]/mL (ref 0.35–4.50)

## 2020-12-16 LAB — CORTISOL: Cortisol, Plasma: 8.5 ug/dL

## 2020-12-16 LAB — HEMOGLOBIN A1C: Hgb A1c MFr Bld: 5 % (ref 4.6–6.5)

## 2020-12-16 LAB — TESTOSTERONE: Testosterone: 55.55 ng/dL — ABNORMAL HIGH (ref 15.00–40.00)

## 2020-12-16 LAB — HIGH SENSITIVITY CRP: CRP, High Sensitivity: 1.05 mg/L (ref 0.000–5.000)

## 2020-12-16 LAB — VITAMIN B12: Vitamin B-12: 204 pg/mL — ABNORMAL LOW (ref 211–911)

## 2020-12-16 NOTE — Progress Notes (Signed)
Morgan Burgess is a 39 y.o. female  Chief Complaint  Patient presents with  . Establish Care    TOC- CPE/labs.  Fasting today. No concerns.  Declines flu shot today.      HPI: Morgan Burgess is a 39 y.o. female seen today for annual CPE, fasting labs. She has no acute issues or concerns. She declines the flu vaccine.  She has a list of labs she would like done based on difficult losing weight and h/o gastric bypass and speaking to homeopathic colleague.   Last PAP: UTD 08/2019 - Dr. Danne Harbor [facility]) Last mammo: due   Diet/Exercise: goes to gym 3-4x/wk, 60-90 min; diet healthy Dental: UTD Vision: wears glasses, UTD  Med refills needed today? no   Past Medical History:  Diagnosis Date  . Depression     Past Surgical History:  Procedure Laterality Date  . CHOLECYSTECTOMY  2004  . DILATION AND CURETTAGE OF UTERUS  2012  . GASTRIC BYPASS  2004    Social History   Socioeconomic History  . Marital status: Married    Spouse name: Not on file  . Number of children: Not on file  . Years of education: Not on file  . Highest education level: Not on file  Occupational History  . Not on file  Tobacco Use  . Smoking status: Never Smoker  . Smokeless tobacco: Never Used  Vaping Use  . Vaping Use: Never used  Substance and Sexual Activity  . Alcohol use: No  . Drug use: No  . Sexual activity: Yes  Other Topics Concern  . Not on file  Social History Narrative  . Not on file   Social Determinants of Health   Financial Resource Strain: Not on file  Food Insecurity: Not on file  Transportation Needs: Not on file  Physical Activity: Not on file  Stress: Not on file  Social Connections: Not on file  Intimate Partner Violence: Not on file    Family History  Problem Relation Age of Onset  . Hypertension Mother   . Hypertension Father   . Diabetes Neg Hx   . Heart attack Neg Hx      Immunization History  Administered Date(s) Administered  . Influenza-Unspecified  07/16/2018  . PFIZER SARS-COV-2 Vaccination 05/20/2020  . Tdap 11/28/2009    Outpatient Encounter Medications as of 12/16/2020  Medication Sig Note  . amphetamine-dextroamphetamine (ADDERALL) 30 MG tablet Take 0.5 tablets by mouth 4 (four) times daily.   . cyanocobalamin (,VITAMIN B-12,) 1000 MCG/ML injection Injext SQ 56mL q2 weeks   . FLUoxetine (PROZAC) 20 MG capsule Take 1 capsule (20 mg total) by mouth daily.   . Ibuprofen-Famotidine (DUEXIS) 800-26.6 MG TABS Take 1 tablet by mouth 3 (three) times daily.   Marland Kitchen levocetirizine (XYZAL) 5 MG tablet Take 5 mg by mouth every evening.   Marland Kitchen levonorgestrel (MIRENA) 20 MCG/24HR IUD 1 each by Intrauterine route continuous. 08/01/2015: Start date 12/2014  . LORazepam (ATIVAN) 0.5 MG tablet TAKE 1 TABLET BY MOUTH EVERY 8 HOURS AS NEEDED   . Olopatadine HCl 0.2 % SOLN UAD OU BID   . Semaglutide-Weight Management (WEGOVY) 2.4 MG/0.75ML SOAJ Inject 2.4 mg into the skin once a week.   . Vitamin D, Ergocalciferol, (DRISDOL) 1.25 MG (50000 UNIT) CAPS capsule TAKE 1 CAPSULE TWO TIMES A WEEK   . zolpidem (AMBIEN) 10 MG tablet TAKE 1 TABLET AT BEDTIME   . Insulin Pen Needle (NOVOFINE) 32G X 6 MM MISC UAD daily with Saxenda (Patient  not taking: Reported on 12/16/2020)    No facility-administered encounter medications on file as of 12/16/2020.     ROS: Gen: no fever, chills  Skin: no rash, itching ENT: no ear pain, ear drainage, nasal congestion, rhinorrhea, sinus pressure, sore throat Eyes: no blurry vision, double vision Resp: no cough, wheeze,SOB CV: no CP, palpitations, LE edema,  GI: no heartburn, n/v/d/c, abd pain GU: no dysuria, urgency, frequency, hematuria MSK: no joint pain, myalgias, back pain Neuro: no dizziness, headache, weakness, vertigo Psych: no depression, anxiety, insomnia   Allergies  Allergen Reactions  . Latex Hives    BP 118/74   Pulse 100   Temp 97.6 F (36.4 C) (Temporal)   Ht 5' 6.75" (1.695 m)   Wt 263 lb 3.2 oz  (119.4 kg)   SpO2 94%   BMI 41.53 kg/m     BP Readings from Last 3 Encounters:  12/16/20 118/74  08/28/20 104/62  02/07/20 131/72    Pulse Readings from Last 3 Encounters:  12/16/20 100  08/28/20 98  02/07/20 85     Physical Exam Constitutional:      General: She is not in acute distress.    Appearance: She is well-developed and well-nourished.  HENT:     Head: Normocephalic and atraumatic.     Right Ear: Tympanic membrane and ear canal normal.     Left Ear: Tympanic membrane and ear canal normal.     Nose: Nose normal.     Mouth/Throat:     Mouth: Oropharynx is clear and moist and mucous membranes are normal.  Eyes:     Conjunctiva/sclera: Conjunctivae normal.     Pupils: Pupils are equal, round, and reactive to light.  Neck:     Thyroid: No thyromegaly.  Cardiovascular:     Rate and Rhythm: Normal rate and regular rhythm.     Pulses: Intact distal pulses.     Heart sounds: Normal heart sounds. No murmur heard.   Pulmonary:     Effort: Pulmonary effort is normal. No respiratory distress.     Breath sounds: Normal breath sounds. No wheezing or rhonchi.  Abdominal:     General: Bowel sounds are normal. There is no distension.     Palpations: Abdomen is soft. There is no mass.     Tenderness: There is no abdominal tenderness.  Musculoskeletal:        General: No edema.     Cervical back: Neck supple.  Lymphadenopathy:     Cervical: No cervical adenopathy.  Skin:    General: Skin is warm and dry.  Neurological:     Mental Status: She is alert and oriented to person, place, and time.     Motor: No abnormal muscle tone.     Coordination: Coordination normal.  Psychiatric:        Mood and Affect: Mood and affect normal.        Behavior: Behavior normal.      A/P:  1. Annual physical exam - discussed importance of regular CV exercise, healthy diet, adequate sleep - PAP UTD, due for mammo - UTD on dental and vision - declines influenza vaccine, needs 2nd  covid vaccine - Comprehensive metabolic panel - CBC - Lipid panel - next CPE in 1 year  2. Vitamin B12 deficiency - every 2-3wks - Vitamin B12  3. Vitamin D deficiency - takes 50,000 3x/wk - VITAMIN D 25 Hydroxy (Vit-D Deficiency, Fractures)  4. Other iron deficiency anemia - CBC - Iron, TIBC and  Ferritin Panel  5. Screening for lipid disorders - Lipid panel  6. Encounter for screening mammogram for malignant neoplasm of breast - MM DIGITAL SCREENING BILATERAL; Future  7. Influenza vaccination declined by patient  8. BMI 40.0-44.9, adult (HCC) - Hemoglobin A1c - TSH - T4, free - T3 - Cortisol - Estradiol - Testosterone - DHEA-sulfate - CRP High sensitivity  9. S/P gastric bypass - Hemoglobin A1c    This visit occurred during the SARS-CoV-2 public health emergency.  Safety protocols were in place, including screening questions prior to the visit, additional usage of staff PPE, and extensive cleaning of exam room while observing appropriate contact time as indicated for disinfecting solutions.

## 2020-12-17 DIAGNOSIS — Z23 Encounter for immunization: Secondary | ICD-10-CM | POA: Diagnosis not present

## 2020-12-17 LAB — IRON,TIBC AND FERRITIN PANEL
%SAT: 36 % (calc) (ref 16–45)
Ferritin: 20 ng/mL (ref 16–154)
Iron: 111 ug/dL (ref 40–190)
TIBC: 307 mcg/dL (calc) (ref 250–450)

## 2020-12-17 LAB — T3: T3, Total: 106 ng/dL (ref 76–181)

## 2020-12-17 LAB — ESTRADIOL: Estradiol: 187 pg/mL

## 2020-12-17 LAB — DHEA-SULFATE: DHEA-SO4: 134 ug/dL (ref 23–266)

## 2020-12-24 ENCOUNTER — Encounter: Payer: BC Managed Care – PPO | Admitting: Family Medicine

## 2020-12-30 ENCOUNTER — Encounter: Payer: Self-pay | Admitting: Family Medicine

## 2020-12-30 DIAGNOSIS — F411 Generalized anxiety disorder: Secondary | ICD-10-CM

## 2021-01-07 ENCOUNTER — Other Ambulatory Visit: Payer: Self-pay

## 2021-01-07 DIAGNOSIS — E559 Vitamin D deficiency, unspecified: Secondary | ICD-10-CM

## 2021-01-07 MED ORDER — VITAMIN D (ERGOCALCIFEROL) 1.25 MG (50000 UNIT) PO CAPS: ORAL_CAPSULE | ORAL | 3 refills | Status: DC

## 2021-01-13 ENCOUNTER — Telehealth: Payer: Self-pay

## 2021-01-13 DIAGNOSIS — Z9884 Bariatric surgery status: Secondary | ICD-10-CM

## 2021-01-13 MED ORDER — FLUOXETINE HCL 40 MG PO CAPS: 40.0000 mg | ORAL_CAPSULE | Freq: Every day | ORAL | 3 refills | Status: DC

## 2021-01-13 MED ORDER — WEGOVY 2.4 MG/0.75ML ~~LOC~~ SOAJ: 2.4000 mg | SUBCUTANEOUS | 5 refills | Status: DC

## 2021-01-14 ENCOUNTER — Other Ambulatory Visit: Payer: Self-pay

## 2021-01-14 DIAGNOSIS — Z9884 Bariatric surgery status: Secondary | ICD-10-CM

## 2021-01-14 MED ORDER — WEGOVY 2.4 MG/0.75ML ~~LOC~~ SOAJ: 2.4000 mg | SUBCUTANEOUS | 3 refills | Status: DC

## 2021-01-14 NOTE — Telephone Encounter (Signed)
The medication amount/quantitiy to dispense is in mL. Each weekly dose is 2.4mg /0.69mL so 16mL would be 4 week supply and 43mL would be 12wk supply. I can write Rx for 74mL and 3 RF which should be a 1 year supply. I cannot send by number of boxes. I sent this Rx to [facility]

## 2021-01-14 NOTE — Telephone Encounter (Signed)
Spoke to patient regarding the RX for the [place].  [facility] needs this to be written for a 90 day supply instead of a 28 day supply. It comes 4 pens per box so she would need 3 boxes with 1 refill.   Please advise.  Thanks.  Dm/cma

## 2021-01-14 NOTE — Telephone Encounter (Signed)
Patient is calling to speak to the nurse regarding her medication. Please give her a call back at [phone].

## 2021-01-14 NOTE — Telephone Encounter (Signed)
Patient notified VIA phone and if any more issues was advised to have them to call us at the office to help straighten it out. Dm/cma

## 2021-01-14 NOTE — Addendum Note (Signed)
Addended by: Overton Mam on: 01/14/2021 03:40 PM   Modules accepted: Orders

## 2021-01-15 ENCOUNTER — Other Ambulatory Visit: Payer: Self-pay

## 2021-01-15 DIAGNOSIS — Z9884 Bariatric surgery status: Secondary | ICD-10-CM

## 2021-01-15 MED ORDER — WEGOVY 2.4 MG/0.75ML ~~LOC~~ SOAJ: 2.4000 mg | SUBCUTANEOUS | 3 refills | Status: AC

## 2021-02-03 ENCOUNTER — Encounter: Payer: Self-pay | Admitting: Family

## 2021-02-05 ENCOUNTER — Other Ambulatory Visit: Payer: BC Managed Care – PPO

## 2021-02-05 ENCOUNTER — Telehealth: Payer: Self-pay | Admitting: Family

## 2021-02-05 ENCOUNTER — Inpatient Hospital Stay: Payer: BC Managed Care – PPO | Attending: Family | Admitting: Family

## 2021-02-05 ENCOUNTER — Other Ambulatory Visit: Payer: Self-pay

## 2021-02-05 ENCOUNTER — Other Ambulatory Visit: Payer: Self-pay | Admitting: Family Medicine

## 2021-02-05 ENCOUNTER — Telehealth: Payer: Self-pay | Admitting: *Deleted

## 2021-02-05 DIAGNOSIS — K909 Intestinal malabsorption, unspecified: Secondary | ICD-10-CM

## 2021-02-05 DIAGNOSIS — D509 Iron deficiency anemia, unspecified: Secondary | ICD-10-CM | POA: Insufficient documentation

## 2021-02-05 DIAGNOSIS — G4700 Insomnia, unspecified: Secondary | ICD-10-CM

## 2021-02-05 DIAGNOSIS — F900 Attention-deficit hyperactivity disorder, predominantly inattentive type: Secondary | ICD-10-CM

## 2021-02-05 MED ORDER — ZOLPIDEM TARTRATE 10 MG PO TABS
ORAL_TABLET | ORAL | 0 refills | Status: DC
Start: 2021-02-05 — End: 2021-05-27

## 2021-02-05 MED ORDER — AMPHETAMINE-DEXTROAMPHETAMINE 30 MG PO TABS: 15.0000 mg | ORAL_TABLET | Freq: Four times a day (QID) | ORAL | 0 refills | Status: DC

## 2021-02-05 NOTE — Telephone Encounter (Signed)
Called Patient - patient at work- told her to view mychart for upcoming appts.

## 2021-02-05 NOTE — Telephone Encounter (Signed)
Refill request for:   Adderall 20 mg LR 12/02/20, # 180, 0 rf  Zolpidem 10 mg  LR 11/28/20, #90 , 0 rf LOV 12/16/20 FOV  None scheduled.   Please review and advise.  Thanks. Dm/cma

## 2021-02-05 NOTE — Progress Notes (Signed)
Hematology and Oncology Follow Up Visit  Morgan Burgess [id number] 29-Mar-1981 40 y.o. 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 Patient confirmed name and date of birth. She has noted increased fatigue and sensitivity to cold.  Iron studies in December were stable with iron saturation 36% and ferritin 20.  She has not noted any blood loss. No cycle with IUD.  No abnormal bruising.  No fever, chills, n/v, cough, rash, dizziness, SOB, chest pain, palpitations, abdominal pain or changes in bowel or bladder habits.  No swelling, tenderness, numbness or tingling in her extremities at this time.  No falls or syncope.  She has maintained a good appetite and is staying well hydrated.   ECOG Performance Status: 1 - Symptomatic but completely ambulatory  Medications:  Allergies as of 02/05/2021      Reactions   Latex Hives      Medication List       Accurate as of February 05, 2021 11:25 AM. If you have any questions, ask your nurse or doctor.        amphetamine-dextroamphetamine 30 MG tablet Commonly known as: Adderall Take 0.5 tablets by mouth 4 (four) times daily.   cyanocobalamin 1000 MCG/ML injection Commonly known as: (VITAMIN B-12) Injext SQ 66mL q2 weeks   Duexis 800-26.6 MG Tabs Generic drug: Ibuprofen-Famotidine Take 1 tablet by mouth 3 (three) times daily.   FLUoxetine 40 MG capsule Commonly known as: PROzac Take 1 capsule (40 mg total) by mouth daily.   Insulin Pen Needle 32G X 6 MM Misc Commonly known as: NovoFine UAD daily with Saxenda   levocetirizine 5 MG tablet Commonly known as: XYZAL Take 5 mg by mouth every evening.   levonorgestrel 20 MCG/24HR IUD Commonly known as: MIRENA 1 each by Intrauterine route continuous.   LORazepam 0.5 MG tablet Commonly known as:  ATIVAN TAKE 1 TABLET BY MOUTH EVERY 8 HOURS AS NEEDED   Olopatadine HCl 0.2 % Soln UAD OU BID   Vitamin D (Ergocalciferol) 1.25 MG (50000 UNIT) Caps capsule Commonly known as: DRISDOL TAKE 1 CAPSULE TWO TIMES A WEEK   Wegovy 2.4 MG/0.75ML Soaj Generic drug: Semaglutide-Weight Management Inject 2.4 mg into the skin once a week.   zolpidem 10 MG tablet Commonly known as: AMBIEN TAKE 1 TABLET AT BEDTIME       Allergies:  Allergies  Allergen Reactions  . Latex Hives    Past Medical History, Surgical history, Social history, and Family History were reviewed and updated.  Review of Systems: All other 10 point review of systems is negative.   Physical Exam:  vitals were not taken for this visit.   Wt Readings from Last 3 Encounters:  12/16/20 263 lb 3.2 oz (119.4 kg)  08/28/20 274 lb (124.3 kg)  02/07/20 268 lb 12.8 oz (121.9 kg)    Ocular: Sclerae unicteric, pupils equal, round and reactive to light Ear-nose-throat: Oropharynx clear, dentition fair Lymphatic: No cervical or supraclavicular adenopathy Lungs no rales or rhonchi, good excursion bilaterally Heart regular rate and rhythm, no murmur appreciated Abd soft, nontender, positive bowel sounds MSK no focal spinal tenderness, no joint edema Neuro: non-focal, well-oriented, appropriate affect Breasts: Deferred   Lab Results  Component Value Date   WBC 4.7 12/16/2020   HGB 13.4 12/16/2020   HCT  39.4 12/16/2020   MCV 90.3 12/16/2020   PLT 193.0 12/16/2020   Lab Results  Component Value Date   FERRITIN 20 12/16/2020   IRON 111 12/16/2020   TIBC 307 12/16/2020   UIBC 228 12/26/2019   IRONPCTSAT 36 12/16/2020   Lab Results  Component Value Date   RBC 4.36 12/16/2020   No results found for: KPAFRELGTCHN, LAMBDASER, KAPLAMBRATIO No results found for: IGGSERUM, IGA, IGMSERUM No results found for: Marda Stalker, SPEI   Chemistry      Component Value  Date/Time   NA 138 12/16/2020 0924   NA 138 12/26/2019 0000   K 4.8 12/16/2020 0924   CL 103 12/16/2020 0924   CO2 29 12/16/2020 0924   BUN 12 12/16/2020 0924   BUN 10 12/26/2019 0000   CREATININE 0.79 12/16/2020 0924   GLU 88 12/26/2019 0000      Component Value Date/Time   CALCIUM 9.4 12/16/2020 0924   ALKPHOS 80 12/16/2020 0924   AST 11 12/16/2020 0924   ALT 7 12/16/2020 0924   BILITOT 0.8 12/16/2020 0924       Impression and Plan: Morgan Burgess is a pleasant 40 yo caucasian female with iron deficiency anemia secondary to malabsorption after gastric bypass (2004).  She has a high deductible plan with insurance so we will try and make this as easy as we can for her.  She will contact our office with any questions or concerns. We can fax lab orders to her lab as needed.  Follow-up in 1 year.  She is in agreement with the plan and will get in touch with as when needed.   Emeline Gins, NP 2/11/202211:25 AM

## 2021-02-05 NOTE — Telephone Encounter (Signed)
Appointments scheduled calendar printed & mailed per 2/11 los 

## 2021-05-27 ENCOUNTER — Other Ambulatory Visit: Payer: Self-pay | Admitting: Family Medicine

## 2021-05-27 DIAGNOSIS — G4700 Insomnia, unspecified: Secondary | ICD-10-CM

## 2021-05-27 DIAGNOSIS — F900 Attention-deficit hyperactivity disorder, predominantly inattentive type: Secondary | ICD-10-CM

## 2021-05-28 MED ORDER — AMPHETAMINE-DEXTROAMPHETAMINE 30 MG PO TABS: 15.0000 mg | ORAL_TABLET | Freq: Four times a day (QID) | ORAL | 0 refills | Status: DC

## 2021-05-28 MED ORDER — ZOLPIDEM TARTRATE 10 MG PO TABS: ORAL_TABLET | ORAL | 0 refills | Status: DC

## 2021-05-28 NOTE — Telephone Encounter (Signed)
Refill request for: Zolpidem 10 mg LR2/11/22,#90, 0 rf  Adderall 30 mg LR 02/05/21, #180, 0 rf LOV 12/16/20 FOV none scheduled.   Please review and advise.  Thanks. Dm/cma

## 2021-05-28 NOTE — Telephone Encounter (Signed)
Request already sent to provider. Dm/cma

## 2021-06-08 ENCOUNTER — Telehealth: Payer: Self-pay

## 2021-06-08 NOTE — Telephone Encounter (Signed)
I spoke with Morgan Burgess from [facility] in regards to medication Wegovy 2.4mg , for a PA.  Pt is eligible for medication from today- 06/08/22.  Case ID # [id number].  Pt aware.

## 2021-06-23 ENCOUNTER — Other Ambulatory Visit: Payer: Self-pay | Admitting: Family Medicine

## 2021-06-23 DIAGNOSIS — G4700 Insomnia, unspecified: Secondary | ICD-10-CM

## 2021-06-24 MED ORDER — ZOLPIDEM TARTRATE 10 MG PO TABS: ORAL_TABLET | ORAL | 0 refills | Status: DC

## 2021-06-24 NOTE — Telephone Encounter (Signed)
Refill request for:  Zolpidem 10 mg LR 05/28/21, #30., 0 rf LOV 12/16/20 FOV 07/07/21  Please review and advise.  Thanks. Dm/cma

## 2021-07-01 ENCOUNTER — Encounter: Payer: Self-pay | Admitting: Family Medicine

## 2021-07-01 NOTE — Telephone Encounter (Signed)
Switched to video pt per pts request when I called her

## 2021-07-07 ENCOUNTER — Encounter: Payer: Self-pay | Admitting: Family Medicine

## 2021-07-07 ENCOUNTER — Telehealth (INDEPENDENT_AMBULATORY_CARE_PROVIDER_SITE_OTHER): Payer: BC Managed Care – PPO | Admitting: Family Medicine

## 2021-07-07 VITALS — BP 102/65 | HR 114 | Temp 98.6°F | Ht 66.0 in | Wt 198.0 lb

## 2021-07-07 DIAGNOSIS — G4700 Insomnia, unspecified: Secondary | ICD-10-CM | POA: Diagnosis not present

## 2021-07-07 DIAGNOSIS — M7740 Metatarsalgia, unspecified foot: Secondary | ICD-10-CM

## 2021-07-07 DIAGNOSIS — Z9884 Bariatric surgery status: Secondary | ICD-10-CM

## 2021-07-07 DIAGNOSIS — Z79899 Other long term (current) drug therapy: Secondary | ICD-10-CM | POA: Diagnosis not present

## 2021-07-07 DIAGNOSIS — F900 Attention-deficit hyperactivity disorder, predominantly inattentive type: Secondary | ICD-10-CM | POA: Diagnosis not present

## 2021-07-07 DIAGNOSIS — E538 Deficiency of other specified B group vitamins: Secondary | ICD-10-CM

## 2021-07-07 MED ORDER — "BD TB SYRINGE 27G X 1/2"" 0.5 ML MISC": 0 refills | Status: DC

## 2021-07-07 MED ORDER — AMPHETAMINE-DEXTROAMPHETAMINE 30 MG PO TABS: 15.0000 mg | ORAL_TABLET | Freq: Four times a day (QID) | ORAL | 0 refills | Status: DC

## 2021-07-07 MED ORDER — ZOLPIDEM TARTRATE 10 MG PO TABS: ORAL_TABLET | ORAL | 0 refills | Status: DC

## 2021-07-07 MED ORDER — CYANOCOBALAMIN 1000 MCG/ML IJ SOLN: INTRAMUSCULAR | 3 refills | Status: DC

## 2021-07-07 MED ORDER — IBUPROFEN-FAMOTIDINE 800-26.6 MG PO TABS
1.0000 | ORAL_TABLET | Freq: Three times a day (TID) | ORAL | 1 refills | Status: DC
Start: 2021-07-07 — End: 2021-07-15

## 2021-07-07 NOTE — Progress Notes (Signed)
Virtual Visit via Video Note  I connected with Morgan Burgess on 07/07/21 at  4:00 PM EDT by a video enabled telemedicine application and verified that I am speaking with the correct person using two identifiers. Location patient: home Location provider: work  Persons participating in the virtual visit: patient, provider  I discussed the limitations of evaluation and management by telemedicine and the availability of in person appointments. The patient expressed understanding and agreed to proceed.  Chief Complaint  Patient presents with   Medication Refill     HPI: Morgan Burgess is a 40 y.o. female patient seen today for f/u on insomnia and ADHD. She needs refills of her medications. For insomnia, pt takes ambien 10mg  qHS. For ADHD, pt takes adderall 30mg  tabs 1/2 tab (15mg ) QID. This regimen was started by previous PCP Dr. .  Pt feels meds are effective, denies any side effects. Sleep is good, appetite is good. No CP, palpitations, dizziness.  Pt also needs refill of B12 and syringes d/t B12 deficiency and h/o gastri bypass.   Past Medical History:  Diagnosis Date   Depression     Past Surgical History:  Procedure Laterality Date   CHOLECYSTECTOMY  2004   DILATION AND CURETTAGE OF UTERUS  2012   GASTRIC BYPASS  2004    Family History  Problem Relation Age of Onset   Hypertension Mother    Hypertension Father    Diabetes Neg Hx    Heart attack Neg Hx     Social History   Tobacco Use   Smoking status: Never   Smokeless tobacco: Never  Vaping Use   Vaping Use: Never used  Substance Use Topics   Alcohol use: No   Drug use: No     Current Outpatient Medications:    FLUoxetine (PROZAC) 40 MG capsule, Take 1 capsule (40 mg total) by mouth daily., Disp: 90 capsule, Rfl: 3   Insulin Pen Needle (NOVOFINE) 32G X 6 MM MISC, UAD daily with Saxenda, Disp: 100 each, Rfl: 3   levocetirizine (XYZAL) 5 MG tablet, Take 5 mg by mouth every evening., Disp: , Rfl:     levonorgestrel (MIRENA) 20 MCG/24HR IUD, 1 each by Intrauterine route continuous., Disp: , Rfl:    LORazepam (ATIVAN) 0.5 MG tablet, TAKE 1 TABLET BY MOUTH EVERY 8 HOURS AS NEEDED, Disp: 30 tablet, Rfl: 2   Tuberculin-Allergy Syringes (B-D TB SYRINGE .5CC/27GX1/2") 27G X 1/2" 0.5 ML MISC, Use as directed., Disp: 100 each, Rfl: 0   Vitamin D, Ergocalciferol, (DRISDOL) 1.25 MG (50000 UNIT) CAPS capsule, TAKE 1 CAPSULE TWO TIMES A WEEK, Disp: 24 capsule, Rfl: 3   amphetamine-dextroamphetamine (ADDERALL) 30 MG tablet, Take 0.5 tablets by mouth 4 (four) times daily., Disp: 60 tablet, Rfl: 0   cyanocobalamin (,VITAMIN B-12,) 1000 MCG/ML injection, Injext SQ 53mL q2 weeks, Disp: 10 mL, Rfl: 3   Ibuprofen-Famotidine (DUEXIS) 800-26.6 MG TABS, Take 1 tablet by mouth 3 (three) times daily., Disp: 270 tablet, Rfl: 1   Olopatadine HCl 0.2 % SOLN, UAD OU BID, Disp: 5 mL, Rfl: 5   WEGOVY 2.4 MG/0.75ML SOAJ, Inject into the skin., Disp: , Rfl:    zolpidem (AMBIEN) 10 MG tablet, TAKE 1 TABLET AT BEDTIME, Disp: 90 tablet, Rfl: 0  Allergies  Allergen Reactions   Latex Hives      ROS: See pertinent positives and negatives per HPI.   EXAM:  VITALS per patient if applicable: BP 102/65 (BP Location: Left Arm, Patient Position: Sitting, Cuff Size: Normal)  Pulse (!) 114   Temp 98.6 F (37 C) (Temporal)   Ht 5\' 6"  (1.676 m)   Wt 198 lb (89.8 kg)   SpO2 99%   BMI 31.96 kg/m   Wt Readings from Last 3 Encounters:  07/07/21 198 lb (89.8 kg)  12/16/20 263 lb 3.2 oz (119.4 kg)  08/28/20 274 lb (124.3 kg)   Temp Readings from Last 3 Encounters:  07/07/21 98.6 F (37 C) (Temporal)  12/16/20 97.6 F (36.4 C) (Temporal)  02/07/20 (!) 97.2 F (36.2 C) (Temporal)   BP Readings from Last 3 Encounters:  07/07/21 102/65  12/16/20 118/74  08/28/20 104/62   Pulse Readings from Last 3 Encounters:  07/07/21 (!) 114  12/16/20 100  08/28/20 98     GENERAL: alert, oriented, appears well and in no acute  distress  HEENT: atraumatic, conjunctiva clear, no obvious abnormalities on inspection of external nose and ears  NECK: normal movements of the head and neck  LUNGS: on inspection no signs of respiratory distress, breathing rate appears normal, no obvious gross SOB, gasping or wheezing, no conversational dyspnea  CV: no obvious cyanosis  PSYCH/NEURO: pleasant and cooperative, speech and thought processing grossly intact   ASSESSMENT AND PLAN:  1. Attention deficit hyperactivity disorder (ADHD), predominantly inattentive type - stable, controlled - database reviewed and appropriate - overdue for UDS and controlled substance agreement. She plans to switch back to [place] office with Dr. [place] so will schedule f/u appt with her and these could be done at that time - DRUG MONITORING, PANEL 8 WITH CONFIRMATION, URINE; Future Refill: - amphetamine-dextroamphetamine (ADDERALL) 30 MG tablet; Take 0.5 tablets by mouth 4 (four) times daily.  Dispense: 60 tablet; Refill: 0  2. Insomnia, unspecified type - stable, controlled - database reviewed and appropriate - overdue for UDS and controlled substance agreement. She plans to switch back to [place] office with Dr. [place] so will schedule f/u appt with her and these could be done at that time - DRUG MONITORING, PANEL 8 WITH CONFIRMATION, URINE; Future Refill: - zolpidem (AMBIEN) 10 MG tablet; TAKE 1 TABLET AT BEDTIME  Dispense: 90 tablet; Refill: 0  3. Long-term current use of stimulant - DRUG MONITORING, PANEL 8 WITH CONFIRMATION, URINE; Future  4. Metatarsalgia, unspecified laterality - chronic, at baseline Refill: - Ibuprofen-Famotidine (DUEXIS) 800-26.6 MG TABS; Take 1 tablet by mouth 3 (three) times daily.  Dispense: 270 tablet; Refill: 1  5. S/P gastric bypass 6. Vitamin B12 deficiency Refill: - cyanocobalamin (,VITAMIN B-12,) 1000 MCG/ML injection; Injext SQ 46mL q2 weeks  Dispense: 10 mL; Refill: 3 - Tuberculin-Allergy  Syringes (B-D TB SYRINGE .5CC/27GX1/2") 27G X 1/2" 0.5 ML MISC; Use as directed.  Dispense: 100 each; Refill: 0 Lab Results  Component Value Date   VITAMINB12 204 (L) 12/16/2020       I discussed the assessment and treatment plan with the patient. The patient was provided an opportunity to ask questions and all were answered. The patient agreed with the plan and demonstrated an understanding of the instructions.   The patient was advised to call back or seek an in-person evaluation if the symptoms worsen or if the condition fails to improve as anticipated.   12/18/2020, DO

## 2021-07-12 ENCOUNTER — Telehealth: Payer: Self-pay

## 2021-07-12 DIAGNOSIS — M7740 Metatarsalgia, unspecified foot: Secondary | ICD-10-CM

## 2021-07-12 DIAGNOSIS — F900 Attention-deficit hyperactivity disorder, predominantly inattentive type: Secondary | ICD-10-CM

## 2021-07-12 NOTE — Telephone Encounter (Signed)
PHARMACIST HAS CONCERNS AND NEEDS CLARIFICATION CONCERNING PATIENTS PRESCRIPTION FOR DUEXIS.  ASKED THAT THIS MESSAGE BE SENT BACK URGENT. REF# FOR WHEN Don Perking IS  [id number]  [facility]!

## 2021-07-12 NOTE — Telephone Encounter (Signed)
Spoke with pharmacist and states Rx is not covered and would require a PA, as well as stating it interacts with her bariatric surgery and is considered a medication that would be harmful for her to take. Please contact the pharmacist at [phone] with reference number [id number]. They would like to discuss this with you.

## 2021-07-14 NOTE — Telephone Encounter (Signed)
Please call pt to let her know med is not covered without PA and she should discuss with prescribing provider if med is appropriate for her or not.

## 2021-07-15 MED ORDER — IBUPROFEN-FAMOTIDINE 800-26.6 MG PO TABS: 1.0000 | ORAL_TABLET | Freq: Three times a day (TID) | ORAL | 1 refills | Status: DC

## 2021-07-15 NOTE — Telephone Encounter (Signed)
Duexis sent to [facility] Adderall - I will send 3 separate Rx (1 for the next 3 mo) to pharm

## 2021-07-15 NOTE — Addendum Note (Signed)
Addended by: Overton Mam on: 07/15/2021 04:20 PM   Modules accepted: Orders

## 2021-07-15 NOTE — Telephone Encounter (Signed)
Pt states the Duexis needs to be sent to [facility] in [facility] in order for a PA to be done and it be approved, she states this is how it has been done in the past. Pt also states Rx for Adderall needs to be QTY 180 because they will not fill her medication all at once the way that it is written now, pt is asking can you write it the same way you did previously? Please advise

## 2021-07-15 NOTE — Telephone Encounter (Signed)
Received a fax from [facility] stating that her insurance will on cover with out doing Step therapy.  She must try 1 prescription H2RA and 1 Prescription Ibuprofen product.    Please review and advise.  Thanks. Dm/cma

## 2021-07-15 NOTE — Telephone Encounter (Signed)
At time of OV I sent 3 separate Rx each for 60 tabs (30 day supply) for 7/22, 8/22, 9/22. This should be fine. Why does quantity need to be 180 in 1 rx?

## 2021-07-29 ENCOUNTER — Telehealth: Payer: Self-pay

## 2021-07-29 NOTE — Telephone Encounter (Signed)
Received a PA approval for the Duexis 800-26.6 mg tabs  approved from 06/28/21 -07/28/22.  Dm/cma

## 2021-09-29 ENCOUNTER — Encounter: Payer: Self-pay | Admitting: Family

## 2021-09-29 DIAGNOSIS — E559 Vitamin D deficiency, unspecified: Secondary | ICD-10-CM | POA: Diagnosis not present

## 2021-09-29 DIAGNOSIS — D509 Iron deficiency anemia, unspecified: Secondary | ICD-10-CM | POA: Diagnosis not present

## 2021-09-29 DIAGNOSIS — D518 Other vitamin B12 deficiency anemias: Secondary | ICD-10-CM | POA: Diagnosis not present

## 2021-09-30 ENCOUNTER — Encounter: Payer: Self-pay | Admitting: *Deleted

## 2021-10-18 ENCOUNTER — Telehealth: Payer: Self-pay

## 2021-10-18 ENCOUNTER — Other Ambulatory Visit: Payer: Self-pay | Admitting: Family Medicine

## 2021-10-18 DIAGNOSIS — G4700 Insomnia, unspecified: Secondary | ICD-10-CM

## 2021-10-18 DIAGNOSIS — M25561 Pain in right knee: Secondary | ICD-10-CM | POA: Diagnosis not present

## 2021-10-18 NOTE — Telephone Encounter (Signed)
Caller name:Morgan Burgess   On DPR? :yes  Call back number:[phone]  Provider they see: Beverely Low   Reason for call:Pt is calling wanting to make appt with T[facility]. Pt was sent to [place] to Dr Remigio Eisenmenger for appt  while Dr Beverely Low was out sick two years ago. Pt said Dr Beverely Low sees her husband and she wants to get back in with her as a doctor. Can I make a TOC appt with you? Or since she was your patient before just make her appt?

## 2021-10-18 NOTE — Telephone Encounter (Signed)
Pt has seen Dr Dayton Martes and Dr Barron Alvine at [place] for the last 4 yrs.  I am happy to have her back but since it has been so long, I have catching up to do and she needs a TOC appt

## 2021-10-18 NOTE — Telephone Encounter (Signed)
I'm not sure this note is in the right chart.  I already responded to [facility] request and she was never at [place].  Please clarify which patient we are referring to

## 2021-10-18 NOTE — Telephone Encounter (Signed)
Pt is wanting to come back to you as a patient? At the time of Covid you recommended that she start seeing the facility in house Dr and not she is no longer with [facility] Dr Courtney Freeman was the facility Dr seeing her.   Call back [phone] Donna Dillard Daughter  

## 2021-10-19 ENCOUNTER — Telehealth: Payer: Self-pay

## 2021-10-19 NOTE — Telephone Encounter (Signed)
error 

## 2021-10-20 NOTE — Telephone Encounter (Signed)
Refill request for following meds:  Ambien  LR 07/07/21, #90, 0 refills  Wegovy  2.4 mg LR  06/08/21 LOV 07/07/21 (Dr Barron Alvine) FOV  12/29/21  (Dr Beverely Low)  Spoke to patient, if she gets a 90 day supply then send to mail order  and if a 30 day supply please send to [facility].    Please review and advise.  Thanks. Dm/cma

## 2021-10-21 MED ORDER — WEGOVY 2.4 MG/0.75ML ~~LOC~~ SOAJ: 2.4000 mg | SUBCUTANEOUS | 0 refills | Status: DC

## 2021-10-21 MED ORDER — ZOLPIDEM TARTRATE 10 MG PO TABS: ORAL_TABLET | ORAL | 0 refills | Status: DC

## 2021-10-25 NOTE — Telephone Encounter (Signed)
Lft VM that rx has been sent to the pharmacy.  If any problems to please let us know.  Dm/cma

## 2021-11-16 ENCOUNTER — Telehealth: Payer: Self-pay | Admitting: Family Medicine

## 2021-11-16 NOTE — Telephone Encounter (Signed)
Pt is needing her WEGOVY 2.4 MG/0.75ML SOAJ [[id number]] and zolpidem (AMBIEN) 10 MG tablet [[id number]] sent to [facility], they will give her a 90/day fill. Please advise pt if any further questions [phone].

## 2021-11-17 ENCOUNTER — Other Ambulatory Visit: Payer: Self-pay | Admitting: Family

## 2021-11-17 DIAGNOSIS — G4700 Insomnia, unspecified: Secondary | ICD-10-CM

## 2021-11-17 MED ORDER — WEGOVY 2.4 MG/0.75ML ~~LOC~~ SOAJ: 2.4000 mg | SUBCUTANEOUS | 0 refills | Status: DC

## 2021-11-17 MED ORDER — ZOLPIDEM TARTRATE 10 MG PO TABS: ORAL_TABLET | ORAL | 0 refills | Status: DC

## 2021-11-23 ENCOUNTER — Other Ambulatory Visit: Payer: Self-pay | Admitting: Family

## 2021-11-23 DIAGNOSIS — G4700 Insomnia, unspecified: Secondary | ICD-10-CM

## 2021-11-23 MED ORDER — ZOLPIDEM TARTRATE 10 MG PO TABS: ORAL_TABLET | ORAL | 0 refills | Status: DC

## 2021-11-23 MED ORDER — WEGOVY 2.4 MG/0.75ML ~~LOC~~ SOAJ: 2.4000 mg | SUBCUTANEOUS | 0 refills | Status: DC

## 2021-11-23 NOTE — Telephone Encounter (Signed)
Pt called back and said that she needs [facility] because her insurance wont cover for the 90 day supply otherwise. Please advise

## 2021-11-23 NOTE — Telephone Encounter (Signed)
Can you please and thank you re-send [facility] for [facility].   Thanks.  Dm/cma

## 2021-11-23 NOTE — Telephone Encounter (Signed)
Patient notified VIA phone. Dm/cma  

## 2021-12-23 ENCOUNTER — Encounter: Payer: Self-pay | Admitting: Family Medicine

## 2021-12-29 ENCOUNTER — Ambulatory Visit (INDEPENDENT_AMBULATORY_CARE_PROVIDER_SITE_OTHER): Payer: BC Managed Care – PPO | Admitting: Family Medicine

## 2021-12-29 ENCOUNTER — Encounter: Payer: Self-pay | Admitting: Family Medicine

## 2021-12-29 VITALS — BP 100/70 | HR 72 | Temp 97.9°F | Resp 16 | Wt 185.6 lb

## 2021-12-29 DIAGNOSIS — Z9884 Bariatric surgery status: Secondary | ICD-10-CM | POA: Diagnosis not present

## 2021-12-29 DIAGNOSIS — G4700 Insomnia, unspecified: Secondary | ICD-10-CM

## 2021-12-29 DIAGNOSIS — F5101 Primary insomnia: Secondary | ICD-10-CM

## 2021-12-29 DIAGNOSIS — M7740 Metatarsalgia, unspecified foot: Secondary | ICD-10-CM | POA: Diagnosis not present

## 2021-12-29 DIAGNOSIS — E669 Obesity, unspecified: Secondary | ICD-10-CM | POA: Insufficient documentation

## 2021-12-29 MED ORDER — ZOLPIDEM TARTRATE 10 MG PO TABS: ORAL_TABLET | ORAL | 0 refills | Status: DC

## 2021-12-29 MED ORDER — WEGOVY 2.4 MG/0.75ML ~~LOC~~ SOAJ: 2.4000 mg | SUBCUTANEOUS | 0 refills | Status: DC

## 2021-12-29 MED ORDER — IBUPROFEN-FAMOTIDINE 800-26.6 MG PO TABS: 1.0000 | ORAL_TABLET | Freq: Three times a day (TID) | ORAL | 1 refills | Status: DC

## 2021-12-29 NOTE — Assessment & Plan Note (Signed)
Pt requires Duexis since having gastric bypass in order to protect her stomach.  Refill provided.

## 2021-12-29 NOTE — Assessment & Plan Note (Signed)
Pt's BMI is now down to 29.96 after losing an additional 13 lbs since July.  We discussed that as long as she is tolerating Wegovy for weight loss or weight maintenance, she does not have to stop it (unless she desires pregnancy).  Pt would like to continue at this time.  Refill sent.

## 2021-12-29 NOTE — Progress Notes (Signed)
° °  Subjective:    Patient ID: Morgan Burgess, female    DOB: 1981-02-03, 41 y.o.   MRN: [id number]  HPI Pt to re-establish care after seeing Dr Deborra Medina and Dr Bryan Lemma  Obesity- pt is currently on Wegovy and has been taking off and on for the last year.  Pt reports doing well.  Denies nausea.  Has lost 13 lbs since July.  There was no plan on when or if to stop medication once weight loss achieved.  Insomnia- ongoing issue for pt.  On Ambien nightly.  Pt feels Ambien has been effective up until just recently but has been off due to the holidays.  No abnormal behaviors while on medication.  S/p gastric bypass- pt is not able to take plain ibuprofen due to hx of gastric bypass.  This requires her to take Duexis.   Review of Systems For ROS see HPI   This visit occurred during the SARS-CoV-2 public health emergency.  Safety protocols were in place, including screening questions prior to the visit, additional usage of staff PPE, and extensive cleaning of exam room while observing appropriate contact time as indicated for disinfecting solutions.      Objective:   Physical Exam Constitutional:      General: She is not in acute distress.    Appearance: Normal appearance. She is well-developed. She is not ill-appearing.  HENT:     Head: Normocephalic and atraumatic.  Eyes:     Conjunctiva/sclera: Conjunctivae normal.     Pupils: Pupils are equal, round, and reactive to light.  Neck:     Thyroid: No thyromegaly.  Cardiovascular:     Rate and Rhythm: Normal rate and regular rhythm.     Pulses: Normal pulses.     Heart sounds: Normal heart sounds. No murmur heard. Pulmonary:     Effort: Pulmonary effort is normal. No respiratory distress.     Breath sounds: Normal breath sounds.  Abdominal:     General: There is no distension.     Palpations: Abdomen is soft.     Tenderness: There is no abdominal tenderness.  Musculoskeletal:     Cervical back: Normal range of motion and neck supple.      Right lower leg: No edema.     Left lower leg: No edema.  Lymphadenopathy:     Cervical: No cervical adenopathy.  Skin:    General: Skin is warm and dry.  Neurological:     Mental Status: She is alert and oriented to person, place, and time.  Psychiatric:        Behavior: Behavior normal.          Assessment & Plan:

## 2021-12-29 NOTE — Patient Instructions (Signed)
Follow up in 6 months to recheck [facility] use and weight loss and schedule your complete physical in December No need for labs today- yay!! Call and schedule your pap and have them send me a copy of the report Call with any questions or concerns Stay Safe!  Stay Healthy! Happy New Year!!!

## 2022-01-14 ENCOUNTER — Telehealth: Payer: Self-pay | Admitting: Family

## 2022-02-04 ENCOUNTER — Ambulatory Visit: Payer: BC Managed Care – PPO | Admitting: Family

## 2022-02-04 ENCOUNTER — Other Ambulatory Visit: Payer: BC Managed Care – PPO

## 2022-02-07 ENCOUNTER — Ambulatory Visit: Payer: BC Managed Care – PPO | Admitting: Family

## 2022-02-07 ENCOUNTER — Other Ambulatory Visit: Payer: BC Managed Care – PPO

## 2022-04-20 ENCOUNTER — Other Ambulatory Visit: Payer: Self-pay | Admitting: Family Medicine

## 2022-04-20 DIAGNOSIS — G4700 Insomnia, unspecified: Secondary | ICD-10-CM

## 2022-04-22 MED ORDER — WEGOVY 2.4 MG/0.75ML ~~LOC~~ SOAJ: 2.4000 mg | SUBCUTANEOUS | 0 refills | Status: DC

## 2022-04-22 MED ORDER — ZOLPIDEM TARTRATE 10 MG PO TABS: ORAL_TABLET | ORAL | 0 refills | Status: DC

## 2022-04-22 NOTE — Telephone Encounter (Signed)
Patient is requesting a refill of the following medications: ?Requested Prescriptions  ? ?Pending Prescriptions Disp Refills  ? WEGOVY 2.4 MG/0.75ML SOAJ 9 mL 0  ?  Sig: Inject 2.4 mg into the skin once a week.  ? zolpidem (AMBIEN) 10 MG tablet 90 tablet 0  ?  Sig: TAKE 1 TABLET AT BEDTIME  ? ? ?Date of patient request: 04/22/2022 ?Last office visit: 12/29/2021 ?Date of last refill: 12/29/2021 ?Last refill amount: 90 capsules and ?Follow up time period per chart: 06/17/2022 ? ?

## 2022-06-17 ENCOUNTER — Ambulatory Visit: Payer: BC Managed Care – PPO | Admitting: Family Medicine

## 2022-07-01 ENCOUNTER — Ambulatory Visit: Payer: BC Managed Care – PPO | Admitting: Family Medicine

## 2022-07-05 ENCOUNTER — Telehealth (INDEPENDENT_AMBULATORY_CARE_PROVIDER_SITE_OTHER): Payer: BC Managed Care – PPO | Admitting: Family Medicine

## 2022-07-05 ENCOUNTER — Other Ambulatory Visit: Payer: Self-pay | Admitting: Family Medicine

## 2022-07-05 ENCOUNTER — Encounter: Payer: Self-pay | Admitting: Family Medicine

## 2022-07-05 VITALS — BP 105/68 | Wt 168.0 lb

## 2022-07-05 DIAGNOSIS — Z8639 Personal history of other endocrine, nutritional and metabolic disease: Secondary | ICD-10-CM

## 2022-07-05 DIAGNOSIS — G4700 Insomnia, unspecified: Secondary | ICD-10-CM | POA: Diagnosis not present

## 2022-07-05 MED ORDER — WEGOVY 2.4 MG/0.75ML ~~LOC~~ SOAJ: 2.4000 mg | SUBCUTANEOUS | 1 refills | Status: DC

## 2022-07-05 MED ORDER — ZOLPIDEM TARTRATE 10 MG PO TABS: ORAL_TABLET | ORAL | 0 refills | Status: DC

## 2022-07-05 NOTE — Patient Instructions (Signed)
-  I sent the medication(s) we discussed to your pharmacy: Meds ordered this encounter  Medications   WEGOVY 2.4 MG/0.75ML SOAJ    Sig: Inject 2.4 mg into the skin once a week.    Dispense:  9 mL    Refill:  1     I hope you are feeling better soon!  Seek in person care promptly if your symptoms worsen, new concerns arise or you are not improving with treatment.  It was nice to meet you today. I help [facility] out with telemedicine visits on Tuesdays and Thursdays and am happy to help if you need a virtual follow up visit on those days. Otherwise, if you have any concerns or questions following this visit please schedule a follow up visit with your Primary Care office or seek care at a local urgent care clinic to avoid delays in care. If you are having severe or life threatening symptoms please call 911 and/or go to the nearest emergency room.

## 2022-07-05 NOTE — Progress Notes (Signed)
Prescription filled early at pt request due to previous delays w/ mail order

## 2022-07-05 NOTE — Progress Notes (Signed)
Virtual Visit via Telephone Note  I connected with Morgan Burgess on 07/05/22 at  6:20 PM EDT by telephone and verified that I am speaking with the correct person using two identifiers.   I discussed the limitations of performing an evaluation and management service by telephone and requested permission for a phone visit. The patient expressed understanding and agreed to proceed.  Location patient:  [place] Location provider: work or home office Participants present for the call: patient, provider Patient did not have a visit with me in the prior 7 days to address this/these issue(s).   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 Request that I let PCP know so that when she gets back on leave does not have issues with refills as takes several weeks to get from mail pharmacy and had issues in the past.     Past Medical History:  Diagnosis Date   Depression     Current Outpatient Medications on File Prior to Visit  Medication Sig Dispense Refill   zolpidem (AMBIEN) 10 MG tablet TAKE 1 TABLET AT BEDTIME 90 tablet 0   Ibuprofen-Famotidine (DUEXIS) 800-26.6 MG TABS Take 1 tablet by mouth 3 (three) times daily. 270 tablet 1   levocetirizine (XYZAL) 5 MG tablet Take 5 mg by mouth every evening.     levonorgestrel (MIRENA) 20 MCG/24HR IUD 1 each by  Intrauterine route continuous.     LORazepam (ATIVAN) 0.5 MG tablet TAKE 1 TABLET BY MOUTH EVERY 8 HOURS AS NEEDED 30 tablet 2   Olopatadine HCl 0.2 % SOLN UAD OU BID 5 mL 5   Vitamin D, Ergocalciferol, (DRISDOL) 1.25 MG (50000 UNIT) CAPS capsule TAKE 1 CAPSULE TWO TIMES A WEEK 24 capsule 3   No current facility-administered medications on file prior to visit.    Observations/Objective: Patient sounds cheerful and well on the phone. I do not appreciate any SOB. Speech and thought processing are grossly intact. Patient reported vitals: Today's Vitals   07/05/22 1838  BP: 105/68  Weight: 168 lb (76.2 kg)   Body mass index is 27.12 kg/m.   Assessment and Plan:  History of obesity  Insomnia, unspecified type  -we discussed possible serious and likely etiologies, options for evaluation and workup, limitations of telemedicine visit vs in person visit, treatment, treatment risks and precautions. Pt prefers to treat via telemedicine empirically rather than in person at this moment.  Discuss risks with wegovy and ambien. Sent refills on wegovy - advised keeping physical with PCP. Discussed tapering down on ambien and she plans to address at physical. She wants me to send message to PCP letting her know has 65 days left of ambien as reports will need a refill in about 4-5 weeks. Sent. Did let this patient know that I do telemedicine on Tuesdays and Thursdays for 05-27-1981  and those are the days I am logged into the system.  I discussed the assessment and treatment plan with the patient. The patient was provided an opportunity to ask questions and all were answered. The patient agreed with the plan and demonstrated an understanding of the instructions.    Follow Up Instructions:  I did not refer this patient for an OV with me in the next 24 hours for this/these issue(s).  I discussed the assessment and treatment plan with the patient. The patient was provided an opportunity to ask questions  and all were answered. The patient agreed with the plan and demonstrated an understanding of the instructions.   I spent 18 minutes on the date of this visit in the care of this patient. See summary of tasks completed to properly care for this patient in the detailed notes above which also included counseling of above, review of PMH, medications, allergies, evaluation of the patient and ordering and/or  instructing patient on testing and care options.     Terressa Koyanagi, DO

## 2022-07-15 ENCOUNTER — Telehealth: Payer: Self-pay | Admitting: Family Medicine

## 2022-07-15 NOTE — Telephone Encounter (Signed)
Caller name: Henchy Mccauley   On DPR? :yes/no: Yes  Call back number: [phone]  Provider they see:  Beverely Low   Reason for call: Pt called stating that pharmacy told her that her PA was denied by her Dr. Rock Nephew wants to know why was it denied.

## 2022-07-15 NOTE — Telephone Encounter (Signed)
Spoke w/ pt . She needs a PA for her Weogovy . I will submit thru cover my meds  and I explained to her that I will inform her of their decision

## 2022-07-18 ENCOUNTER — Telehealth: Payer: Self-pay

## 2022-07-21 NOTE — Telephone Encounter (Signed)
Spoke w/ pt and advised that we have [facility] the PA form waiting on response

## 2022-07-21 NOTE — Telephone Encounter (Signed)
Caller name: [name] (pt)  On DPR? :yes/no: Yes  Call back number: [phone]  Provider they see: Dr. Beverely Low  Reason for call: Pt saw Dr. Selena Batten 07/05/22; Needs new authorization for wegovy 2.4 mg because the dx code that Dr. Selena Batten used is different than the one Dr. Beverely Low had previously used. Pt has been told by [facility] that they need an new authorization from DR. Tabori in order to fill the rx.  Pt states that pharmacy does has medication in stock.

## 2022-07-25 NOTE — Telephone Encounter (Signed)
I have faxed the forms to [facility] waiting on response

## 2022-07-25 NOTE — Telephone Encounter (Signed)
Form completed and returned to CMA to fax.  

## 2022-07-27 ENCOUNTER — Telehealth: Payer: Self-pay | Admitting: Family Medicine

## 2022-07-27 NOTE — Telephone Encounter (Signed)
Please let pt know we have completed multiple forms, submitted multiple diagnoses (including her bariatric surgery) and they continue to deny this.  A lot of insurance companies change their formularies in January and July... and this may be what happened.  She can certainly call or write them to appeal, but we have done all we can do.

## 2022-07-27 NOTE — Telephone Encounter (Signed)
Caller name: Audelia Acton w/ CoverMyMeds  On DPR? :yes/no: No  Call back number:  [phone] ref [facility]  Provider they see: Dr. Beverely Low  Reason for call: Reginal Lutes Prior auth assistance

## 2022-07-27 NOTE — Telephone Encounter (Signed)
Spoke w/ pt and I advised her that [facility] sent [name] a denial letter on her [facility]. She advised that [facility] approved her medication till 02/2023

## 2022-07-29 NOTE — Telephone Encounter (Signed)
I spoke to the pt and she advised that her Morgan Burgess was approved and she got an email from [facility] stating they have shipped her medicine .

## 2022-09-04 ENCOUNTER — Other Ambulatory Visit: Payer: Self-pay | Admitting: Family Medicine

## 2022-09-04 DIAGNOSIS — G4700 Insomnia, unspecified: Secondary | ICD-10-CM

## 2022-09-05 NOTE — Telephone Encounter (Signed)
Patient is requesting a refill of the following medications: Requested Prescriptions   Pending Prescriptions Disp Refills   zolpidem (AMBIEN) 10 MG tablet [Pharmacy Med Name: ZOLPIDEM TARTRATE TABS 10MG ] 90 tablet 0    Sig: TAKE 1 TABLET AT BEDTIME    Date of patient request: 09/05/22 Last office visit: 12/29/21 Date of last refill: 07/05/22 Last refill amount: 90 Follow up time period per chart: 6 months

## 2022-10-31 ENCOUNTER — Telehealth: Payer: Self-pay

## 2022-10-31 ENCOUNTER — Other Ambulatory Visit (HOSPITAL_COMMUNITY): Payer: Self-pay

## 2022-10-31 ENCOUNTER — Encounter: Payer: Self-pay | Admitting: Family

## 2022-10-31 NOTE — Telephone Encounter (Signed)
Patient Advocate Encounter  Received notification from [facility] that prior authorization is required for Ibuprofen-Famotidine 800-26.6MG  tablets. PA submitted and APPROVED on 10/31/2022.  Key [id number]  Effective: 10/01/2022 - 10/31/2023  MAXIMUM DAY SUPPLY OF 030 ALLOWED

## 2022-10-31 NOTE — Telephone Encounter (Signed)
Pharmacy needing PA on pt RX  Duexis 800-26.6 mg

## 2022-10-31 NOTE — Telephone Encounter (Signed)
Pt has been notified that her Rx was approved

## 2022-12-01 DIAGNOSIS — F419 Anxiety disorder, unspecified: Secondary | ICD-10-CM | POA: Diagnosis not present

## 2022-12-02 ENCOUNTER — Encounter: Payer: Self-pay | Admitting: Family Medicine

## 2022-12-02 ENCOUNTER — Other Ambulatory Visit: Payer: Self-pay | Admitting: Family Medicine

## 2022-12-02 ENCOUNTER — Ambulatory Visit (INDEPENDENT_AMBULATORY_CARE_PROVIDER_SITE_OTHER): Payer: BC Managed Care – PPO | Admitting: Family Medicine

## 2022-12-02 VITALS — BP 112/76 | HR 94 | Temp 101.5°F | Resp 18 | Ht 66.0 in | Wt 171.1 lb

## 2022-12-02 DIAGNOSIS — K909 Intestinal malabsorption, unspecified: Secondary | ICD-10-CM

## 2022-12-02 DIAGNOSIS — F509 Eating disorder, unspecified: Secondary | ICD-10-CM

## 2022-12-02 DIAGNOSIS — E559 Vitamin D deficiency, unspecified: Secondary | ICD-10-CM | POA: Diagnosis not present

## 2022-12-02 DIAGNOSIS — R251 Tremor, unspecified: Secondary | ICD-10-CM | POA: Diagnosis not present

## 2022-12-02 DIAGNOSIS — G4700 Insomnia, unspecified: Secondary | ICD-10-CM

## 2022-12-02 DIAGNOSIS — F411 Generalized anxiety disorder: Secondary | ICD-10-CM

## 2022-12-02 LAB — POCT INFLUENZA A/B
Influenza A, POC: NEGATIVE
Influenza B, POC: NEGATIVE

## 2022-12-02 LAB — POC COVID19 BINAXNOW: SARS Coronavirus 2 Ag: NEGATIVE

## 2022-12-02 MED ORDER — FLUOXETINE HCL 10 MG PO CAPS: 10.0000 mg | ORAL_CAPSULE | Freq: Every day | ORAL | 3 refills | Status: DC

## 2022-12-02 MED ORDER — ALPRAZOLAM 0.5 MG PO TABS: 0.5000 mg | ORAL_TABLET | Freq: Two times a day (BID) | ORAL | 1 refills | Status: DC | PRN

## 2022-12-02 NOTE — Patient Instructions (Addendum)
Follow up in 3-4 weeks to recheck anxiety/mood.  Sooner if needed We'll notify you of your lab results and make any changes if needed STOP the Adderall START the Fluoxetine 1 daily Continue to hold the [facility] USE the Alprazolam as needed for panicked or high anxiety moments Make sure you are eating regularly and having protein to stabilize your sugars Drink LOTS of water Call with any questions or concerns Hang in there!

## 2022-12-02 NOTE — Progress Notes (Signed)
   Subjective:    Patient ID: Morgan Burgess, female    DOB: 01/02/1981, 41 y.o.   MRN: [id number]  HPI ER f/u- pt went to ER yesterday complaining of hypoglycemia.  She is currently on Wegovy but 2 weeks ago restarted her Prozac and Adderall on her own.  She reported lightheadedness, tremors, nausea, and sweating.  She had not eaten yet that day.  Per the ER note she had a 'full blown panic attack' while the provider was in the room.  HR increased to 110 and she was given IV Ativan.  This significantly improved her sxs.  Pt reports sugars have been up and down b/c she hasn't been eating due to restarting Adderall.  Pt admits that when she develops hypoglycemia episodes she panics.  Pt stopped the [facility] 3 weeks ago.  Pt reports increased stress recently.    Today has fever.  No cough, sinus congestion, N/V.  No sore throat.   Review of Systems For ROS see HPI     Objective:   Physical Exam        Assessment & Plan:

## 2022-12-03 LAB — VITAMIN D 25 HYDROXY (VIT D DEFICIENCY, FRACTURES): Vit D, 25-Hydroxy: 38 ng/mL (ref 30–100)

## 2022-12-03 LAB — IRON,TIBC AND FERRITIN PANEL
%SAT: 28 % (calc) (ref 16–45)
Ferritin: 18 ng/mL (ref 16–232)
Iron: 87 ug/dL (ref 40–190)
TIBC: 314 mcg/dL (calc) (ref 250–450)

## 2022-12-03 LAB — TSH: TSH: 1.43 mIU/L

## 2022-12-04 NOTE — Assessment & Plan Note (Signed)
Deteriorated.  Pt restarted her Prozac and Adderall ~2 weeks ago.  Started at doses that were likely too high as that's what she had available at home.  She feels that she is having episodes of hypoglycemia.  Stopped the [employer].  Has not been eating due to restarting Adderall.  When she develops sxs of hypoglycemia she starts to panic.  ER note indicates she had a panic attack while there and improved w/ IV Ativan.  In office today pt is hyperventilating , sobbing.  Worked with her to do some deep breathing to calm herself down.  Pt admits to high levels of stress recently but will not elaborate.  Will stop Adderall.  Restart Fluoxetine but at a lower dose.  Pt given Alprazolam to use prn.  Discussed the need to eat regularly.  Will check labs to assess thyroid to make sure this isn't contributing.  Pt expressed understanding and is in agreement w/ plan.

## 2022-12-04 NOTE — Assessment & Plan Note (Signed)
Check labs and replete prn. 

## 2022-12-05 ENCOUNTER — Telehealth: Payer: Self-pay

## 2022-12-05 NOTE — Telephone Encounter (Signed)
Ambien 10 mg LOV: 12/02/22 Last Refill:09/05/22 Upcoming appt: 12/30/22

## 2022-12-05 NOTE — Telephone Encounter (Signed)
-----   Message from Sheliah Hatch, MD sent at 12/04/2022  9:03 AM EST ----- Labs look good!  No changes at this time

## 2022-12-05 NOTE — Telephone Encounter (Signed)
Informed pt that her Rx has been sent  

## 2022-12-05 NOTE — Telephone Encounter (Signed)
Informed pt of lab results  

## 2022-12-30 ENCOUNTER — Ambulatory Visit: Payer: BC Managed Care – PPO | Admitting: Family Medicine

## 2023-01-02 ENCOUNTER — Other Ambulatory Visit: Payer: Self-pay | Admitting: Family Medicine

## 2023-01-03 NOTE — Telephone Encounter (Signed)
Pt needs appt or should come from PCP. Thanks!

## 2023-01-06 ENCOUNTER — Telehealth (INDEPENDENT_AMBULATORY_CARE_PROVIDER_SITE_OTHER): Payer: BC Managed Care – PPO | Admitting: Family Medicine

## 2023-01-06 ENCOUNTER — Encounter: Payer: Self-pay | Admitting: Family Medicine

## 2023-01-06 VITALS — BP 102/70 | Wt 186.0 lb

## 2023-01-06 DIAGNOSIS — F411 Generalized anxiety disorder: Secondary | ICD-10-CM

## 2023-01-06 DIAGNOSIS — E669 Obesity, unspecified: Secondary | ICD-10-CM

## 2023-01-06 MED ORDER — FLUOXETINE HCL 20 MG PO CAPS: 20.0000 mg | ORAL_CAPSULE | Freq: Every day | ORAL | 1 refills | Status: DC

## 2023-01-06 MED ORDER — ZEPBOUND 7.5 MG/0.5ML ~~LOC~~ SOAJ: 7.5000 mg | SUBCUTANEOUS | 0 refills | Status: DC

## 2023-01-06 NOTE — Progress Notes (Signed)
Virtual Visit via Video   I connected with patient on 01/06/23 at  1:20 PM EST by a video enabled telemedicine application and verified that I am speaking with the correct person using two identifiers.  Location patient: Home Location provider: [place], Office Persons participating in the virtual visit: Patient, Provider, [place] Marcille Blanco C)  I discussed the limitations of evaluation and management by telemedicine and the availability of in person appointments. The patient expressed understanding and agreed to proceed.  Subjective:   HPI:   Anxiety- at last visit we re-started pt's Fluoxetine but at a lower dose and stopped her Adderall due to severe anxiety.  Pt reports doing better.  Feeling less overwhelmed, less anxious.  Less tearful.  Pt is interested in increasing dose as she was previously on 40mg  daily.  Pt feels that Adderall was the main trigger for anxiety.    Obesity- pt would like to restart GLP-1 agonist but prefers Zepbound over [place].  ROS:   See pertinent positives and negatives per HPI.  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   Attention deficit hyperactivity disorder (ADHD) 09/16/2016   Thinning hair 11/28/2014   Generalized anxiety disorder 11/01/2013   S/P gastric bypass 11/01/2013   Metatarsalgia 05/16/2011    Social History   Tobacco Use   Smoking status: Never   Smokeless tobacco: Never  Substance Use Topics   Alcohol use: No    Current Outpatient Medications:    ALPRAZolam (XANAX) 0.5 MG tablet, Take 1 tablet (0.5 mg total) by mouth 2 (two) times daily as needed for anxiety., Disp: 30 tablet, Rfl: 1   FLUoxetine  (PROZAC) 10 MG capsule, Take 1 capsule (10 mg total) by mouth daily., Disp: 30 capsule, Rfl: 3   Ibuprofen-Famotidine (DUEXIS) 800-26.6 MG TABS, Take 1 tablet by mouth 3 (three) times daily., Disp: 270 tablet, Rfl: 1   levocetirizine (XYZAL) 5 MG tablet, Take 5 mg by mouth every evening., Disp: , Rfl:    levonorgestrel (MIRENA) 20 MCG/24HR IUD, 1 each by Intrauterine route continuous., Disp: , Rfl:    zolpidem (AMBIEN) 10 MG tablet, TAKE 1 TABLET AT BEDTIME, Disp: 90 tablet, Rfl: 0   amphetamine-dextroamphetamine (ADDERALL) 12.5 MG tablet, Take 1 tablet by mouth daily. (Patient not taking: Reported on 01/06/2023), Disp: , Rfl:    Vitamin D, Ergocalciferol, (DRISDOL) 1.25 MG (50000 UNIT) CAPS capsule, TAKE 1 CAPSULE TWO TIMES A WEEK (Patient not taking: Reported on 01/06/2023), Disp: 24 capsule, Rfl: 3   WEGOVY 2.4 MG/0.75ML SOAJ, Inject 2.4 mg into the skin once a week. (Patient not taking: Reported on 01/06/2023), Disp: 9 mL, Rfl: 1  Allergies  Allergen Reactions   Latex Hives    Objective:   BP 102/70   Wt 186 lb (84.4 kg)   BMI 30.02 kg/m  AAOx3, NAD NCAT, EOMI No obvious CN deficits Coloring WNL Pt is able to speak clearly, coherently without shortness of breath or increased work of breathing.  Thought process is linear.  Mood is appropriate.   Assessment and Plan:   Anxiety- improved w/ stopping Adderall and restarting low dose Fluoxetine.  Pt would like to increase Fluoxetine to 20mg  daily- new prescription sent to  mail order.  Obesity- will send script for Zepbound but cautioned pt that this may not be covered by her insurance.   Annye Asa, MD 01/06/2023

## 2023-02-17 ENCOUNTER — Other Ambulatory Visit: Payer: Self-pay | Admitting: Family Medicine

## 2023-02-17 DIAGNOSIS — G4700 Insomnia, unspecified: Secondary | ICD-10-CM

## 2023-02-17 MED ORDER — ZOLPIDEM TARTRATE 10 MG PO TABS: 10.0000 mg | ORAL_TABLET | Freq: Every day | ORAL | 0 refills | Status: DC

## 2023-02-17 NOTE — Telephone Encounter (Signed)
Pt aware that Rx has been sent in

## 2023-02-17 NOTE — Telephone Encounter (Signed)
Ambien 10 mg  LOV: 01/06/23 Last Refill:12/05/22 Upcoming appt: none

## 2023-03-06 ENCOUNTER — Encounter: Payer: Self-pay | Admitting: Family Medicine

## 2023-03-07 MED ORDER — ZEPBOUND 10 MG/0.5ML ~~LOC~~ SOAJ: 10.0000 mg | SUBCUTANEOUS | 1 refills | Status: DC

## 2023-03-07 MED ORDER — ALPRAZOLAM 0.5 MG PO TABS: 0.5000 mg | ORAL_TABLET | Freq: Two times a day (BID) | ORAL | 1 refills | Status: DC | PRN

## 2023-03-07 NOTE — Telephone Encounter (Signed)
Patient is requesting a refill of the following medications: Requested Prescriptions   Pending Prescriptions Disp Refills   ALPRAZolam (XANAX) 0.5 MG tablet 30 tablet 1    Sig: Take 1 tablet (0.5 mg total) by mouth 2 (two) times daily as needed for anxiety.    Date of patient request: 03/07/23 Last office visit: 12/02/22 Date of last refill: 12/02/22 Last refill amount: 30x1 Follow up time period per chart: 3-4 weeks  Pt is also requesting increase in zepbound but pt is also overdue for follow up, please advise

## 2023-03-23 DIAGNOSIS — M79672 Pain in left foot: Secondary | ICD-10-CM | POA: Diagnosis not present

## 2023-03-23 DIAGNOSIS — M722 Plantar fascial fibromatosis: Secondary | ICD-10-CM | POA: Diagnosis not present

## 2023-03-23 DIAGNOSIS — M79671 Pain in right foot: Secondary | ICD-10-CM | POA: Diagnosis not present

## 2023-03-23 DIAGNOSIS — I739 Peripheral vascular disease, unspecified: Secondary | ICD-10-CM | POA: Diagnosis not present

## 2023-03-28 DIAGNOSIS — E162 Hypoglycemia, unspecified: Secondary | ICD-10-CM | POA: Diagnosis not present

## 2023-04-04 ENCOUNTER — Encounter: Payer: Self-pay | Admitting: Family Medicine

## 2023-04-04 MED ORDER — ZEPBOUND 5 MG/0.5ML ~~LOC~~ SOAJ: 5.0000 mg | SUBCUTANEOUS | 1 refills | Status: DC

## 2023-04-04 NOTE — Telephone Encounter (Signed)
Pt has sent some readings for review, also asking if we have samples of continuous glucos monitors and we do have some freestyle libre 3 if that is acceptable but no other samples.   Please advise

## 2023-04-12 ENCOUNTER — Telehealth: Payer: Self-pay | Admitting: Family Medicine

## 2023-04-12 NOTE — Telephone Encounter (Signed)
Caller name: Kiley Solimine  On DPR?: Yes  Call back number: [phone] (mobile)  Provider they see: Sheliah Hatch, MD  Reason for call: Had a bad episode last night. If she can see about hypogylcemia is triggering anxiety- advise

## 2023-04-12 NOTE — Telephone Encounter (Signed)
Spoke w/ pt and she is coming in tomorrow for a discussion w/ Dr Beverely Low Pt states her blood sugars are up and down .  She has stopped the Zepbound 2 weeks ago .  Started with a  GSM on Friday she gets anxious when her sugars drop then she gets them to high

## 2023-04-13 ENCOUNTER — Ambulatory Visit (INDEPENDENT_AMBULATORY_CARE_PROVIDER_SITE_OTHER): Payer: BC Managed Care – PPO | Admitting: Family Medicine

## 2023-04-13 ENCOUNTER — Encounter: Payer: Self-pay | Admitting: Family Medicine

## 2023-04-13 ENCOUNTER — Other Ambulatory Visit: Payer: Self-pay | Admitting: Family Medicine

## 2023-04-13 VITALS — BP 116/62 | HR 79 | Temp 98.4°F | Resp 17 | Ht 66.0 in | Wt 182.1 lb

## 2023-04-13 DIAGNOSIS — E162 Hypoglycemia, unspecified: Secondary | ICD-10-CM | POA: Diagnosis not present

## 2023-04-13 DIAGNOSIS — K909 Intestinal malabsorption, unspecified: Secondary | ICD-10-CM

## 2023-04-13 DIAGNOSIS — F411 Generalized anxiety disorder: Secondary | ICD-10-CM

## 2023-04-13 LAB — HEMOGLOBIN A1C: Hgb A1c MFr Bld: 5 % (ref 4.6–6.5)

## 2023-04-13 LAB — BASIC METABOLIC PANEL
BUN: 7 mg/dL (ref 6–23)
CO2: 28 mEq/L (ref 19–32)
Calcium: 9.2 mg/dL (ref 8.4–10.5)
Chloride: 103 mEq/L (ref 96–112)
Creatinine, Ser: 0.77 mg/dL (ref 0.40–1.20)
GFR: 94.68 mL/min (ref >60.00)
Glucose, Bld: 99 mg/dL (ref 70–99)
Potassium: 3.9 mEq/L (ref 3.5–5.1)
Sodium: 139 mEq/L (ref 135–145)

## 2023-04-13 MED ORDER — WEGOVY 0.5 MG/0.5ML ~~LOC~~ SOAJ: 0.5000 mg | SUBCUTANEOUS | 0 refills | Status: DC

## 2023-04-13 MED ORDER — ALPRAZOLAM 0.5 MG PO TABS: 0.5000 mg | ORAL_TABLET | Freq: Two times a day (BID) | ORAL | 1 refills | Status: DC | PRN

## 2023-04-13 MED ORDER — FLUOXETINE HCL 40 MG PO CAPS: 40.0000 mg | ORAL_CAPSULE | Freq: Every day | ORAL | 3 refills | Status: DC

## 2023-04-13 NOTE — Patient Instructions (Signed)
Follow up in 6 weeks to recheck anxiety We'll notify you of your lab results and make any changes if needed STOP the Zepbound RESTART Wegovy at 0.5mg  weekly We'll call you to schedule nutrition and endocrinology DOUBLE the Fluoxetine to  daily Call with any questions or concerns Hang in there!!

## 2023-04-13 NOTE — Progress Notes (Signed)
   Subjective:    Patient ID: Morgan Burgess, female    DOB: June 12, 1981, 42 y.o.   MRN: [id number]  HPI Anxiety- pt reports she is 'having a lot more anxiety b/c of the hypoglycemia'.  Pt reports when she increased Zepbound to  her 'sugars bottom out'.  When sugars drop, this causes her to panic.  Pt reports sugars are dropping to 40-50s.  She went to ER on 4/3 and sugars ran between 81-110.  Her friend who is an Endo PA encouraged her to switch to [facility] rather than Zepbound.  Pt never had issues w/ hypoglycemia when she was on Wegovy.  Pt is now using a CGM and begins to panic when her levels change.   Review of Systems For ROS see HPI     Objective:   Physical Exam Vitals reviewed.  Constitutional:      General: She is not in acute distress.    Appearance: Normal appearance. She is well-developed. She is not ill-appearing.  HENT:     Head: Normocephalic and atraumatic.  Eyes:     Conjunctiva/sclera: Conjunctivae normal.     Pupils: Pupils are equal, round, and reactive to light.  Neck:     Thyroid: No thyromegaly.  Cardiovascular:     Rate and Rhythm: Normal rate and regular rhythm.     Heart sounds: Normal heart sounds. No murmur heard. Pulmonary:     Effort: Pulmonary effort is normal. No respiratory distress.     Breath sounds: Normal breath sounds.  Abdominal:     General: There is no distension.     Palpations: Abdomen is soft.     Tenderness: There is no abdominal tenderness.  Musculoskeletal:     Cervical back: Normal range of motion and neck supple.  Lymphadenopathy:     Cervical: No cervical adenopathy.  Skin:    General: Skin is warm and dry.  Neurological:     General: No focal deficit present.     Mental Status: She is alert and oriented to person, place, and time.  Psychiatric:        Behavior: Behavior normal.     Comments: anxious           Assessment & Plan:  Hypoglycemia- new.  Pt reports sugars have been dropping low since starting Zepbound.   She did not have any symptomatic lows while on Wegovy.  Would like to switch back.  Prescriptions sent.  Did discuss need to eat regularly and to include protein at each meal to stabilize sugars and avoid highs and lows seen w/ carbs.  She wants a referral to Endo- provided.

## 2023-04-14 ENCOUNTER — Encounter: Payer: Self-pay | Admitting: Family Medicine

## 2023-04-14 ENCOUNTER — Encounter: Payer: Self-pay | Admitting: Family

## 2023-04-14 ENCOUNTER — Other Ambulatory Visit: Payer: Self-pay

## 2023-04-14 MED ORDER — LEVOCETIRIZINE DIHYDROCHLORIDE 5 MG PO TABS
5.0000 mg | ORAL_TABLET | Freq: Every evening | ORAL | 1 refills | Status: DC
  Filled 2023-04-14: qty 90, 90d supply, fill #0

## 2023-04-14 MED ORDER — LEVOCETIRIZINE DIHYDROCHLORIDE 5 MG PO TABS: 5.0000 mg | ORAL_TABLET | Freq: Every evening | ORAL | 1 refills | Status: DC

## 2023-04-15 LAB — IRON,TIBC AND FERRITIN PANEL
%SAT: 26 % (calc) (ref 16–45)
Ferritin: 21 ng/mL (ref 16–232)
Iron: 71 ug/dL (ref 40–190)
TIBC: 274 mcg/dL (calc) (ref 250–450)

## 2023-04-15 LAB — INSULIN, RANDOM: Insulin: 10.4 u[IU]/mL

## 2023-04-15 NOTE — Assessment & Plan Note (Signed)
Deteriorated.  Pt is panicking about her blood sugars dropping.  I told her that using a CGM is a bad idea as she is becoming obsessive.  Will increase Fluoxetine to  daily and monitor for improvement.  She is to continue to use Alprazolam as needed.  Will follow.

## 2023-04-15 NOTE — Assessment & Plan Note (Signed)
Check iron panel to assess levels

## 2023-04-17 ENCOUNTER — Telehealth: Payer: Self-pay

## 2023-04-17 NOTE — Telephone Encounter (Signed)
Pt seen results via my chart  

## 2023-04-17 NOTE — Telephone Encounter (Signed)
-----   Message from Sheliah Hatch, MD sent at 04/17/2023  7:12 AM EDT ----- Labs look great!  No changes at this time

## 2023-04-19 DIAGNOSIS — E162 Hypoglycemia, unspecified: Secondary | ICD-10-CM | POA: Diagnosis not present

## 2023-04-20 ENCOUNTER — Telehealth: Payer: Self-pay

## 2023-04-20 ENCOUNTER — Other Ambulatory Visit (HOSPITAL_BASED_OUTPATIENT_CLINIC_OR_DEPARTMENT_OTHER): Payer: Self-pay

## 2023-04-20 ENCOUNTER — Encounter: Payer: Self-pay | Admitting: Family

## 2023-04-20 ENCOUNTER — Other Ambulatory Visit (HOSPITAL_COMMUNITY): Payer: Self-pay

## 2023-04-20 MED ORDER — WEGOVY 0.5 MG/0.5ML ~~LOC~~ SOAJ
0.5000 mg | SUBCUTANEOUS | 3 refills | Status: DC
  Filled 2023-04-20 – 2023-05-01 (×4): qty 2, 28d supply, fill #0
  Filled 2023-05-25: qty 2, 28d supply, fill #1
  Filled 2023-06-20 – 2023-12-05 (×3): qty 2, 28d supply, fill #2

## 2023-04-20 NOTE — Telephone Encounter (Signed)
Called the pt and explained that [facility] denied her Rx for [facility] . She explained she is going to Endocrinologist and she wants them to obtain the PA for the [facility]

## 2023-04-21 ENCOUNTER — Encounter: Payer: Self-pay | Admitting: Family Medicine

## 2023-04-21 ENCOUNTER — Other Ambulatory Visit: Payer: Self-pay

## 2023-04-21 DIAGNOSIS — M7740 Metatarsalgia, unspecified foot: Secondary | ICD-10-CM

## 2023-04-21 MED ORDER — IBUPROFEN-FAMOTIDINE 800-26.6 MG PO TABS
1.0000 | ORAL_TABLET | Freq: Three times a day (TID) | ORAL | 1 refills | Status: DC
Start: 2023-04-21 — End: 2023-08-29

## 2023-04-21 NOTE — Telephone Encounter (Signed)
I have called [facility] and done a over the phone verbal PA for the pt Wegovy   . Spoke w/ Inetta Fermo at [employer] pt Rx has been denied due to non obesity or diabetes or cardiac issues  Case ID is [id number]

## 2023-04-21 NOTE — Telephone Encounter (Signed)
Heather from [facility] called to get a PA completed for patient Wegovy 0.78m. Heather contact number is [phone]. Fax number is [phone]

## 2023-04-26 DIAGNOSIS — E162 Hypoglycemia, unspecified: Secondary | ICD-10-CM | POA: Diagnosis not present

## 2023-04-26 DIAGNOSIS — E8881 Metabolic syndrome: Secondary | ICD-10-CM | POA: Diagnosis not present

## 2023-04-27 ENCOUNTER — Other Ambulatory Visit (HOSPITAL_BASED_OUTPATIENT_CLINIC_OR_DEPARTMENT_OTHER): Payer: Self-pay

## 2023-04-27 ENCOUNTER — Other Ambulatory Visit (HOSPITAL_COMMUNITY): Payer: Self-pay

## 2023-04-28 NOTE — Telephone Encounter (Signed)
Pt informed us her Morgan Burgess was approved states she received the email on /04/27/23/

## 2023-05-01 ENCOUNTER — Other Ambulatory Visit (HOSPITAL_BASED_OUTPATIENT_CLINIC_OR_DEPARTMENT_OTHER): Payer: Self-pay

## 2023-05-01 ENCOUNTER — Other Ambulatory Visit (HOSPITAL_COMMUNITY): Payer: Self-pay

## 2023-05-02 ENCOUNTER — Encounter: Payer: Self-pay | Admitting: Family

## 2023-05-02 ENCOUNTER — Other Ambulatory Visit (HOSPITAL_COMMUNITY): Payer: Self-pay

## 2023-05-02 MED ORDER — GVOKE HYPOPEN 2-PACK 1 MG/0.2ML ~~LOC~~ SOAJ
SUBCUTANEOUS | 1 refills | Status: AC
  Filled 2023-05-02: qty 0.4, 20d supply, fill #0
  Filled 2023-05-02: qty 0.4, 30d supply, fill #0
  Filled 2023-12-05: qty 0.4, 20d supply, fill #1

## 2023-05-03 ENCOUNTER — Other Ambulatory Visit (HOSPITAL_COMMUNITY): Payer: Self-pay

## 2023-05-04 ENCOUNTER — Telehealth: Payer: Self-pay

## 2023-05-04 NOTE — Telephone Encounter (Signed)
Patient Advocate Encounter  Received a fax from [facility] regarding Prior Authorization for [facility] 0.5MG /0.5ML auto-injectors.   Key: [id number]  Authorization has been DENIED due to

## 2023-05-04 NOTE — Telephone Encounter (Signed)
I spoke w/ pt and she states she received a letter stating she was approved and her pharmacy is filling .

## 2023-05-10 DIAGNOSIS — E8881 Metabolic syndrome: Secondary | ICD-10-CM | POA: Diagnosis not present

## 2023-05-10 DIAGNOSIS — E162 Hypoglycemia, unspecified: Secondary | ICD-10-CM | POA: Diagnosis not present

## 2023-05-11 ENCOUNTER — Encounter: Payer: Self-pay | Admitting: Family

## 2023-05-12 ENCOUNTER — Other Ambulatory Visit: Payer: Self-pay

## 2023-05-16 ENCOUNTER — Other Ambulatory Visit: Payer: Self-pay | Admitting: Family Medicine

## 2023-05-16 DIAGNOSIS — G4700 Insomnia, unspecified: Secondary | ICD-10-CM

## 2023-05-17 MED ORDER — ZOLPIDEM TARTRATE 10 MG PO TABS: 10.0000 mg | ORAL_TABLET | Freq: Every day | ORAL | 0 refills | Status: DC

## 2023-05-17 NOTE — Telephone Encounter (Signed)
Ambien 10 mg LOV: 04/13/23 Last Refill:02/17/23 Upcoming appt: 05/26/23

## 2023-05-17 NOTE — Telephone Encounter (Signed)
Pt aware of refill.

## 2023-05-25 ENCOUNTER — Encounter: Payer: Self-pay | Admitting: Family Medicine

## 2023-05-25 ENCOUNTER — Other Ambulatory Visit: Payer: Self-pay

## 2023-05-26 ENCOUNTER — Encounter: Payer: Self-pay | Admitting: Family Medicine

## 2023-05-26 ENCOUNTER — Telehealth (INDEPENDENT_AMBULATORY_CARE_PROVIDER_SITE_OTHER): Payer: BC Managed Care – PPO | Admitting: Family Medicine

## 2023-05-26 VITALS — Ht 66.0 in | Wt 193.0 lb

## 2023-05-26 DIAGNOSIS — F411 Generalized anxiety disorder: Secondary | ICD-10-CM

## 2023-05-26 NOTE — Progress Notes (Signed)
Virtual Visit via Video   I connected with patient on 05/26/23 at  2:20 PM EDT by a video enabled telemedicine application and verified that I am speaking with the correct person using two identifiers.  Location patient: Home Location provider: [place], Office Persons participating in the virtual visit: Patient, Provider, CMA Sheryle Hail C)  I discussed the limitations of evaluation and management by telemedicine and the availability of in person appointments. The patient expressed understanding and agreed to proceed.  Subjective:   HPI:   Anxiety- at last visit we doubled the Fluoxetine to 40mg  daily.  Pt reports anxiety is 'doing better'.  Pt feels like medication dose is appropriate.  A lot of anxiety stemmed from labile blood sugars.  She has seen Endo and these have leveled out somewhat.    ROS:   See pertinent positives and negatives per HPI.  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   Attention deficit hyperactivity disorder (ADHD) 09/16/2016   Thinning hair 11/28/2014   Generalized anxiety disorder 11/01/2013   S/P gastric bypass 11/01/2013   Metatarsalgia 05/16/2011    Social History   Tobacco Use   Smoking status: Never   Smokeless tobacco: Never  Substance Use Topics   Alcohol use: No    Current Outpatient Medications:    acarbose (PRECOSE) 25 MG tablet, Take 25 mg by mouth 2 (two) times daily., Disp: , Rfl:    ALPRAZolam (XANAX) 0.5 MG tablet, Take 1 tablet (0.5 mg total) by mouth 2 (two) times daily as needed for anxiety., Disp: 30 tablet, Rfl: 1   FLUoxetine (PROZAC) 40 MG capsule, Take 1 capsule (40 mg total) by mouth daily., Disp:  90 capsule, Rfl: 3   Glucagon (GVOKE HYPOPEN 2-PACK) 1 MG/0.2ML SOAJ, Inject once into the skin as needed for severe hypoglycemia, Disp: 0.4 mL, Rfl: 1   Ibuprofen-Famotidine (DUEXIS) 800-26.6 MG TABS, Take 1 tablet by mouth 3 (three) times daily., Disp: 270 tablet, Rfl: 1   levocetirizine (XYZAL) 5 MG tablet, Take 1 tablet (5 mg total) by mouth every evening., Disp: 90 tablet, Rfl: 1   levonorgestrel (MIRENA) 20 MCG/24HR IUD, 1 each by Intrauterine route continuous., Disp: , Rfl:    Semaglutide-Weight Management (WEGOVY) 0.5 MG/0.5ML SOAJ, Inject 0.5 mg into the skin once a week., Disp: 6 mL, Rfl: 0   Semaglutide-Weight Management (WEGOVY) 0.5 MG/0.5ML SOAJ, Inject 0.5 mg into the skin once a week., Disp: 2 mL, Rfl: 3   zolpidem (AMBIEN) 10 MG tablet, Take 1 tablet (10 mg total) by mouth at bedtime., Disp: 90 tablet, Rfl: 0  Allergies  Allergen Reactions   Latex Hives    Objective:   Ht 5\' 6"  (1.676 m)   Wt 193 lb (87.5 kg)   BMI 31.15 kg/m  AAOx3, NAD NCAT, EOMI No obvious CN deficits Coloring WNL Pt is able to speak clearly, coherently without shortness of breath or increased work of breathing.  Thought process is linear.  Mood is appropriate.   Assessment and Plan:   Anxiety- improved w/ increased dose of Fluoxetine.  She feels dose is appropriate and things are improving as she gets her health issues under better control.  No med changes  at this time.  Will follow.   Neena Rhymes, MD 05/26/2023

## 2023-05-31 DIAGNOSIS — R600 Localized edema: Secondary | ICD-10-CM | POA: Diagnosis not present

## 2023-05-31 DIAGNOSIS — M7989 Other specified soft tissue disorders: Secondary | ICD-10-CM | POA: Diagnosis not present

## 2023-05-31 DIAGNOSIS — I87393 Chronic venous hypertension (idiopathic) with other complications of bilateral lower extremity: Secondary | ICD-10-CM | POA: Diagnosis not present

## 2023-05-31 DIAGNOSIS — M79605 Pain in left leg: Secondary | ICD-10-CM | POA: Diagnosis not present

## 2023-05-31 DIAGNOSIS — I781 Nevus, non-neoplastic: Secondary | ICD-10-CM | POA: Diagnosis not present

## 2023-05-31 DIAGNOSIS — M79604 Pain in right leg: Secondary | ICD-10-CM | POA: Diagnosis not present

## 2023-06-21 ENCOUNTER — Ambulatory Visit: Payer: BC Managed Care – PPO | Admitting: Psychology

## 2023-06-28 DIAGNOSIS — Z9884 Bariatric surgery status: Secondary | ICD-10-CM | POA: Diagnosis not present

## 2023-07-05 ENCOUNTER — Ambulatory Visit (INDEPENDENT_AMBULATORY_CARE_PROVIDER_SITE_OTHER): Payer: BC Managed Care – PPO | Admitting: Psychology

## 2023-07-05 DIAGNOSIS — F411 Generalized anxiety disorder: Secondary | ICD-10-CM | POA: Diagnosis not present

## 2023-07-05 NOTE — Progress Notes (Signed)
[facility] Counselor Initial Adult Exam  Name: Morgan Burgess Date: 07/05/2023 MRN: [id number] DOB: 12/29/1980 PCP: Sheliah Hatch, MD  Time spent: 4:00pm-4:55pm   55 minutes  Guardian/Payee:  Donnamarie Poag requested: No   Reason for Visit /Presenting Problem: Pt present for face-to-face initial assessment in person.  Pt has a history of anxiety.  She worries about the "what ifs".   Pt has had concerns about her health when she had a hypoglycemic episode.  Pt had a panic attack bc of her health issues.  Pt is seeing physicians to try to figure out what is going on with her physically.   Pt has had gastric bypass surgery in 2004. Pt has trouble sleeping.  Pt has been on ambien for 12 years and can't sleep without it.   Pt is always on the go during the day and has trouble slowing down.  It's hard for her to sit still.    Mental Status Exam: Appearance:   Casual     Behavior:  Appropriate  Motor:  Normal  Speech/Language:   Normal Rate  Affect:  Appropriate  Mood:  normal  Thought process:  normal  Thought content:    WNL  Sensory/Perceptual disturbances:    WNL  Orientation:  oriented to person, place, time/date, and situation  Attention:  Good  Concentration:  Good  Memory:  WNL  Fund of knowledge:   Good  Insight:    Good  Judgment:   Good  Impulse Control:  Good    Reported Symptoms:  anxiety  Risk Assessment: Danger to Self:  No Self-injurious Behavior: No Danger to Others: No Duty to Warn:no Physical Aggression / Violence:No  Access to Firearms a concern: No  Gang Involvement:No  Patient / guardian was educated about steps to take if suicide or homicide risk level increases between visits: n/a While future psychiatric events cannot be accurately predicted, the patient does not currently require acute inpatient psychiatric care and does not currently meet [facility] involuntary commitment criteria.  Substance Abuse History: Current  substance abuse: No     Past Psychiatric History:   Previous psychological history is significant for anxiety Outpatient Providers:pt has been in therapy in the past with Colen Darling History of Psych Hospitalization: No  Psychological Testing:  n/a    Abuse History:  Victim of: No.,  n/a    Report needed: No. Victim of Neglect:No. Perpetrator of  n/a   Witness / Exposure to Domestic Violence: No   Protective Services Involvement: No  Witness to [employer] Violence:  No   Family History:  Family History  Problem Relation Age of Onset   Hypertension Mother    Hypertension Father    Diabetes Neg Hx    Heart attack Neg Hx     Living situation: the patient lives with her husband and 3 children.  Pt grew up with both parents and one younger brother who died when he was 57. Pt's brother died of aspiration when drinking.  Family history of mental health issues:   none No childhood abuse. No substance abuse.   Sexual Orientation: Straight  Relationship Status: married since 2006.  Pt's marriage is a source of stress.  Name of spouse / other:Douglas If a parent, number of children / ages:3 children all daughters ages 40, 76 and 73.   Pt's marriage has problems.  There is a lack of communication.  Pt is very independent and active and her husband sits and watches  tv.   Pt states her husband complains a lot.  He has cardiac issues.  He had his first heart attack at age 26.  He does not follow up medically like he should.  Pt and husband do not have common interests.   Pt's husband works at [employer] and has his own Holiday representative business.  Pt states her husband does not help with the kids.   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   Military Service: No   Educational History: Education: Risk manager: none  Any cultural differences that may  affect / interfere with treatment:  not applicable   Recreation/Hobbies: pt coaches cheerleading and likes to fish.  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    Stressors: Health problems   Marital or family conflict    Strengths: Supportive Relationships, Hopefulness, Self Advocate, and Able to Communicate Effectively  Barriers:  none   Legal History: Pending legal issue / charges: The patient has no significant history of legal issues. History of legal issue / charges:  n/a  Medical History/Surgical History: reviewed Past Medical History:  Diagnosis Date   Depression     Past Surgical History:  Procedure Laterality Date   CHOLECYSTECTOMY  2004   DILATION AND CURETTAGE OF UTERUS  2012   GASTRIC BYPASS  2004    Medications: Current Outpatient Medications  Medication Sig Dispense Refill   acarbose (PRECOSE) 25 MG tablet Take 25 mg by mouth 2 (two) times daily.     ALPRAZolam (XANAX) 0.5 MG tablet Take 1 tablet (0.5 mg total) by mouth 2 (two) times daily as needed for anxiety. 30 tablet 1   FLUoxetine (PROZAC) 40 MG capsule Take 1 capsule (40 mg total) by mouth daily. 90 capsule 3   Glucagon (GVOKE HYPOPEN 2-PACK) 1 MG/0.2ML SOAJ Inject once into the skin as needed for severe hypoglycemia 0.4 mL 1   Ibuprofen-Famotidine (DUEXIS) 800-26.6 MG TABS Take 1 tablet by mouth 3 (three) times daily. 270 tablet 1   levocetirizine (XYZAL) 5 MG tablet Take 1 tablet (5 mg total) by mouth every evening. 90 tablet 1   levonorgestrel (MIRENA) 20 MCG/24HR IUD 1 each by Intrauterine route continuous.     Semaglutide-Weight Management (WEGOVY) 0.5 MG/0.5ML SOAJ Inject 0.5 mg into the skin once a week. 6 mL 0   Semaglutide-Weight Management (WEGOVY) 0.5 MG/0.5ML SOAJ Inject 0.5 mg into the skin once a week. 2 mL 3   zolpidem (AMBIEN) 10 MG tablet Take 1 tablet (10 mg total) by mouth at bedtime. 90 tablet 0   No current facility-administered medications for this visit.    Allergies   Allergen Reactions   Latex Hives    Diagnoses:  F41.1  Plan of Care: Recommend ongoing therapy.  Pt participated in setting treatment goals.  Pt wants to improves coping skills.  Pt would like to have a safe place to talk about her life stressors.   Plan to meet every two weeks.  Treatment Plan Client Abilities/Strengths  Pt is bright, engaging and motivated for therapy.  Client Treatment Preferences  Individual therapy.  Client Statement of Needs  Improve coping skills.  Symptoms  Autonomic hyperactivity (e.g., palpitations, shortness of breath, dry mouth, trouble swallowing, nausea, diarrhea). Excessive and/or unrealistic worry that is difficult to control occurring more days than not for at least 6 months about a number of events or activities. Hypervigilance (  e.g., feeling constantly on edge, experiencing concentration difficulties, having trouble falling or staying asleep, exhibiting a general state of irritability). Motor tension (e.g., restlessness, tiredness, shakiness, muscle tension). Problems Addressed  Anxiety Goals 1. Enhance ability to effectively cope with the full variety of life's worries and anxieties. 2. Learn and implement coping skills that result in a reduction of anxiety and worry, and improved daily functioning. Objective Learn to accept limitations in life and commit to tolerating, rather than avoiding, unpleasant emotions while accomplishing meaningful goals. Target Date: 2024-07-04 Frequency: Biweekly Progress: 10 Modality: individual Related Interventions 1. Use techniques from Acceptance and Commitment Therapy to help client accept uncomfortable realities such as lack of complete control, imperfections, and uncertainty and tolerate unpleasant emotions and thoughts in order to accomplish value-consistent goals. Objective Learn and implement problem-solving strategies for realistically addressing worries. Target Date: 2024-07-04 Frequency:  Biweekly Progress: 10 Modality: individual Related Interventions 1. Assign the client a homework exercise in which he/she problem-solves a current problem.  review, reinforce success, and provide corrective feedback toward improvement. 2. Teach the client problem-solving strategies involving specifically defining a problem, generating options for addressing it, evaluating the pros and cons of each option, selecting and implementing an optional action, and reevaluating and refining the action. Objective Learn and implement calming skills to reduce overall anxiety and manage anxiety symptoms. Target Date: 2024-07-04 Frequency: Biweekly Progress: 10 Modality: individual Related Interventions 1. Assign the client to read about progressive muscle relaxation and other calming strategies in relevant books or treatment manuals (e.g., Progressive Relaxation Training by Robb Matar and Alen Blew; Mastery of Your Anxiety and Worry: Workbook by Earlie Counts). 2. Assign the client homework each session in which he/she practices relaxation exercises daily, gradually applying them progressively from non-anxiety-provoking to anxiety-provoking situations; review and reinforce success while providing corrective feedback toward improvement. 3. Teach the client calming/relaxation skills (e.g., applied relaxation, progressive muscle relaxation, cue controlled relaxation; mindful breathing; biofeedback) and how to discriminate better between relaxation and tension; teach the client how to apply these skills to his/her daily life. 3. Reduce overall frequency, intensity, and duration of the anxiety so that daily functioning is not impaired. 4. Resolve the core conflict that is the source of anxiety. 5. Stabilize anxiety level while increasing ability to function on a daily basis. Diagnosis :    F41.1  Generalized Anxiety Disorder  Conditions For Discharge Achievement of treatment goals and objectives.     Ciclaly Mulcahey, LCSW

## 2023-07-06 ENCOUNTER — Other Ambulatory Visit (HOSPITAL_COMMUNITY): Payer: Self-pay

## 2023-07-13 DIAGNOSIS — Z713 Dietary counseling and surveillance: Secondary | ICD-10-CM | POA: Diagnosis not present

## 2023-07-13 DIAGNOSIS — Z9884 Bariatric surgery status: Secondary | ICD-10-CM | POA: Diagnosis not present

## 2023-07-13 DIAGNOSIS — E669 Obesity, unspecified: Secondary | ICD-10-CM | POA: Diagnosis not present

## 2023-07-19 ENCOUNTER — Ambulatory Visit (INDEPENDENT_AMBULATORY_CARE_PROVIDER_SITE_OTHER): Payer: BC Managed Care – PPO | Admitting: Psychology

## 2023-07-19 DIAGNOSIS — F411 Generalized anxiety disorder: Secondary | ICD-10-CM | POA: Diagnosis not present

## 2023-07-19 NOTE — Progress Notes (Signed)
[facility] Counselor/Therapist Progress Note  Patient ID: Morgan Burgess, MRN: [id number],    Date: 07/19/2023  Time Spent: 4:00pm - 4:50pm  50 minutes   Treatment Type: Individual Therapy  Reported Symptoms: stress, frustration  Mental Status Exam: Appearance:  Casual     Behavior: Appropriate  Motor: Normal  Speech/Language:  Normal Rate  Affect: Appropriate  Mood: normal  Thought process: normal  Thought content:   WNL  Sensory/Perceptual disturbances:   WNL  Orientation: oriented to person, place, time/date, and situation  Attention: Good  Concentration: Good  Memory: WNL  Fund of knowledge:  Good  Insight:   Good  Judgment:  Good  Impulse Control: Good   Risk Assessment: Danger to Self:  No Self-injurious Behavior: No Danger to Others: No Duty to Warn:no Physical Aggression / Violence:No  Access to Firearms a concern: No  Gang Involvement:No   Subjective: Pt present for face-to-face individual therapy in person.   Pt talked about her relationship with her husband.   He tends to text her during her work day and expects her to answer even when she is busy at work.   She tells him repeatedly but he does not listen to what she requests.  Worked on problem solving communication issues.   Addressed what pt and her husbands love languages are.   Pt ranked her love languages as being Acts of service, Quality time, Affirmations,  Touch, Gifts.    She thinks her Husband's love languages are Quality time, Touch, Acts of service, gifts, Affirmations.   Pt plans to have a conversation with her husband about love languages.   Helped pt process her feelings and relationship dynamics.  Provided supportive therapy.    Interventions: Cognitive Behavioral Therapy and Insight-Oriented  Diagnosis:  F41.1  Plan of Care: Recommend ongoing therapy.  Pt participated in setting treatment goals.  Pt wants to improves coping skills.  Pt would like to have a safe place to talk  about her life stressors.   Plan to meet every two weeks.  Treatment Plan Client Abilities/Strengths  Pt is bright, engaging and motivated for therapy.  Client Treatment Preferences  Individual therapy.  Client Statement of Needs  Improve coping skills.  Symptoms  Autonomic hyperactivity (e.g., palpitations, shortness of breath, dry mouth, trouble swallowing, nausea, diarrhea). Excessive and/or unrealistic worry that is difficult to control occurring more days than not for at least 6 months about a number of events or activities. Hypervigilance (e.g., feeling constantly on edge, experiencing concentration difficulties, having trouble falling or staying asleep, exhibiting a general state of irritability). Motor tension (e.g., restlessness, tiredness, shakiness, muscle tension). Problems Addressed  Anxiety Goals 1. Enhance ability to effectively cope with the full variety of life's worries and anxieties. 2. Learn and implement coping skills that result in a reduction of anxiety and worry, and improved daily functioning. Objective Learn to accept limitations in life and commit to tolerating, rather than avoiding, unpleasant emotions while accomplishing meaningful goals. Target Date: 2024-07-04 Frequency: Biweekly Progress: 10 Modality: individual Related Interventions 1. Use techniques from Acceptance and Commitment Therapy to help client accept uncomfortable realities such as lack of complete control, imperfections, and uncertainty and tolerate unpleasant emotions and thoughts in order to accomplish value-consistent goals. Objective Learn and implement problem-solving strategies for realistically addressing worries. Target Date: 2024-07-04 Frequency: Biweekly Progress: 10 Modality: individual Related Interventions 1. Assign the client a homework exercise in which he/she problem-solves a current problem.  review, reinforce success, and provide corrective feedback toward  improvement. 2. Teach  the client problem-solving strategies involving specifically defining a problem, generating options for addressing it, evaluating the pros and cons of each option, selecting and implementing an optional action, and reevaluating and refining the action. Objective Learn and implement calming skills to reduce overall anxiety and manage anxiety symptoms. Target Date: 2024-07-04 Frequency: Biweekly Progress: 10 Modality: individual Related Interventions 1. Assign the client to read about progressive muscle relaxation and other calming strategies in relevant books or treatment manuals (e.g., Progressive Relaxation Training by Robb Matar and Alen Blew; Mastery of Your Anxiety and Worry: Workbook by Earlie Counts). 2. Assign the client homework each session in which he/she practices relaxation exercises daily, gradually applying them progressively from non-anxiety-provoking to anxiety-provoking situations; review and reinforce success while providing corrective feedback toward improvement. 3. Teach the client calming/relaxation skills (e.g., applied relaxation, progressive muscle relaxation, cue controlled relaxation; mindful breathing; biofeedback) and how to discriminate better between relaxation and tension; teach the client how to apply these skills to his/her daily life. 3. Reduce overall frequency, intensity, and duration of the anxiety so that daily functioning is not impaired. 4. Resolve the core conflict that is the source of anxiety. 5. Stabilize anxiety level while increasing ability to function on a daily basis. Diagnosis :    F41.1  Generalized Anxiety Disorder  Conditions For Discharge Achievement of treatment goals and objectives.  Morgan Talwar, LCSW

## 2023-08-09 ENCOUNTER — Ambulatory Visit: Payer: BC Managed Care – PPO | Admitting: Psychology

## 2023-08-18 ENCOUNTER — Other Ambulatory Visit: Payer: Self-pay | Admitting: Family Medicine

## 2023-08-18 DIAGNOSIS — G4700 Insomnia, unspecified: Secondary | ICD-10-CM

## 2023-08-18 NOTE — Telephone Encounter (Signed)
Ambien 10 mg Requested Prescriptions   Pending Prescriptions Disp Refills   zolpidem (AMBIEN) 10 MG tablet 90 tablet 0    Sig: Take 1 tablet (10 mg total) by mouth at bedtime.     Date of patient request: 08/18/23 Last office visit: 12/02/22 Date of last refill: 05/17/23 Last refill amount: 90 Follow up time period per chart: 3 to 4 weeks

## 2023-08-21 MED ORDER — ZOLPIDEM TARTRATE 10 MG PO TABS
10.0000 mg | ORAL_TABLET | Freq: Every day | ORAL | 0 refills | Status: DC
Start: 2023-08-21 — End: 2023-10-16

## 2023-08-28 ENCOUNTER — Other Ambulatory Visit: Payer: Self-pay | Admitting: Family Medicine

## 2023-08-29 ENCOUNTER — Other Ambulatory Visit: Payer: Self-pay

## 2023-08-29 DIAGNOSIS — M7740 Metatarsalgia, unspecified foot: Secondary | ICD-10-CM

## 2023-08-29 MED ORDER — IBUPROFEN-FAMOTIDINE 800-26.6 MG PO TABS
1.0000 | ORAL_TABLET | Freq: Three times a day (TID) | ORAL | 1 refills | Status: DC
Start: 2023-08-29 — End: 2023-12-15

## 2023-09-12 ENCOUNTER — Ambulatory Visit (INDEPENDENT_AMBULATORY_CARE_PROVIDER_SITE_OTHER): Payer: BC Managed Care – PPO | Admitting: Psychology

## 2023-09-12 DIAGNOSIS — F411 Generalized anxiety disorder: Secondary | ICD-10-CM

## 2023-09-12 NOTE — Progress Notes (Signed)
[facility] Counselor/Therapist Progress Note  Patient ID: Morgan Burgess, MRN: [id number],    Date: 09/12/2023  Time Spent: 4:00pm - 4:45pm  45 minutes   Treatment Type: Individual Therapy  Reported Symptoms: stress, frustration  Mental Status Exam: Appearance:  Casual     Behavior: Appropriate  Motor: Normal  Speech/Language:  Normal Rate  Affect: Appropriate  Mood: normal  Thought process: normal  Thought content:   WNL  Sensory/Perceptual disturbances:   WNL  Orientation: oriented to person, place, time/date, and situation  Attention: Good  Concentration: Good  Memory: WNL  Fund of knowledge:  Good  Insight:   Good  Judgment:  Good  Impulse Control: Good   Risk Assessment: Danger to Self:  No Self-injurious Behavior: No Danger to Others: No Duty to Warn:no Physical Aggression / Violence:No  Access to Firearms a concern: No  Gang Involvement:No   Subjective: Pt present for face-to-face individual therapy via video.  Pt consents to telehealth video session and is aware of limitations and benefits of virtual sessions.  Location of pt: home Location of therapist: home office.   Pt talked about her relationship with her husband.  Pt talked with him about their love languages.   They took a test online and the results were what pt would have predicted.    Pt ranked her love languages as being Acts of service, Quality time, Affirmations,  Touch, Gifts.   Her Husband's love languages are Quality time, Touch, Acts of service, gifts, Affirmations.    Helped pt process her feelings and relationship dynamics.  Pt states her anxiety has been well managed lately.   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   Provided supportive therapy.    Interventions: Cognitive Behavioral Therapy and Insight-Oriented  Diagnosis:  F41.1  Plan of Care: Recommend ongoing therapy.  Pt participated in setting treatment goals.  Pt  wants to improves coping skills.  Pt would like to have a safe place to talk about her life stressors.   Plan to meet every two weeks.  Treatment Plan Client Abilities/Strengths  Pt is bright, engaging and motivated for therapy.  Client Treatment Preferences  Individual therapy.  Client Statement of Needs  Improve coping skills.  Symptoms  Autonomic hyperactivity (e.g., palpitations, shortness of breath, dry mouth, trouble swallowing, nausea, diarrhea). Excessive and/or unrealistic worry that is difficult to control occurring more days than not for at least 6 months about a number of events or activities. Hypervigilance (e.g., feeling constantly on edge, experiencing concentration difficulties, having trouble falling or staying asleep, exhibiting a general state of irritability). Motor tension (e.g., restlessness, tiredness, shakiness, muscle tension). Problems Addressed  Anxiety Goals 1. Enhance ability to effectively cope with the full variety of life's worries and anxieties. 2. Learn and implement coping skills that result in a reduction of anxiety and worry, and improved daily functioning. Objective Learn to accept limitations in life and commit to tolerating, rather than avoiding, unpleasant emotions while accomplishing meaningful goals. Target Date: 2024-07-04 Frequency: Biweekly Progress: 10 Modality: individual Related Interventions 1. Use techniques from Acceptance and Commitment Therapy to help client accept uncomfortable realities such as lack of complete control, imperfections, and uncertainty and tolerate unpleasant emotions and thoughts in order to accomplish value-consistent goals. Objective Learn and implement problem-solving strategies for realistically addressing worries. Target Date: 2024-07-04 Frequency: Biweekly Progress: 10 Modality: individual Related Interventions 1. Assign the client a homework exercise in which he/she  problem-solves a current problem.  review,  reinforce success, and provide corrective feedback toward improvement. 2. Teach the client problem-solving strategies involving specifically defining a problem, generating options for addressing it, evaluating the pros and cons of each option, selecting and implementing an optional action, and reevaluating and refining the action. Objective Learn and implement calming skills to reduce overall anxiety and manage anxiety symptoms. Target Date: 2024-07-04 Frequency: Biweekly Progress: 10 Modality: individual Related Interventions 1. Assign the client to read about progressive muscle relaxation and other calming strategies in relevant books or treatment manuals (e.g., Progressive Relaxation Training by Robb Matar and Alen Blew; Mastery of Your Anxiety and Worry: Workbook by Earlie Counts). 2. Assign the client homework each session in which he/she practices relaxation exercises daily, gradually applying them progressively from non-anxiety-provoking to anxiety-provoking situations; review and reinforce success while providing corrective feedback toward improvement. 3. Teach the client calming/relaxation skills (e.g., applied relaxation, progressive muscle relaxation, cue controlled relaxation; mindful breathing; biofeedback) and how to discriminate better between relaxation and tension; teach the client how to apply these skills to his/her daily life. 3. Reduce overall frequency, intensity, and duration of the anxiety so that daily functioning is not impaired. 4. Resolve the core conflict that is the source of anxiety. 5. Stabilize anxiety level while increasing ability to function on a daily basis. Diagnosis :    F41.1  Generalized Anxiety Disorder  Conditions For Discharge Achievement of treatment goals and objectives.  Nealy Hickmon, LCSW

## 2023-09-26 ENCOUNTER — Ambulatory Visit: Payer: BC Managed Care – PPO | Admitting: Psychology

## 2023-10-02 DIAGNOSIS — G8918 Other acute postprocedural pain: Secondary | ICD-10-CM | POA: Diagnosis not present

## 2023-10-02 DIAGNOSIS — K56691 Other complete intestinal obstruction: Secondary | ICD-10-CM | POA: Diagnosis not present

## 2023-10-02 DIAGNOSIS — Z9884 Bariatric surgery status: Secondary | ICD-10-CM | POA: Diagnosis not present

## 2023-10-02 DIAGNOSIS — Z7985 Long-term (current) use of injectable non-insulin antidiabetic drugs: Secondary | ICD-10-CM | POA: Diagnosis not present

## 2023-10-02 DIAGNOSIS — Z79899 Other long term (current) drug therapy: Secondary | ICD-10-CM | POA: Diagnosis not present

## 2023-10-02 DIAGNOSIS — K55021 Focal (segmental) acute infarction of small intestine: Secondary | ICD-10-CM | POA: Diagnosis not present

## 2023-10-02 DIAGNOSIS — E876 Hypokalemia: Secondary | ICD-10-CM | POA: Diagnosis not present

## 2023-10-02 DIAGNOSIS — K56601 Complete intestinal obstruction, unspecified as to cause: Secondary | ICD-10-CM | POA: Diagnosis not present

## 2023-10-02 DIAGNOSIS — R1084 Generalized abdominal pain: Secondary | ICD-10-CM | POA: Diagnosis not present

## 2023-10-02 DIAGNOSIS — R001 Bradycardia, unspecified: Secondary | ICD-10-CM | POA: Diagnosis not present

## 2023-10-02 DIAGNOSIS — K561 Intussusception: Secondary | ICD-10-CM | POA: Diagnosis not present

## 2023-10-02 DIAGNOSIS — K5651 Intestinal adhesions [bands], with partial obstruction: Secondary | ICD-10-CM | POA: Diagnosis not present

## 2023-10-02 DIAGNOSIS — K631 Perforation of intestine (nontraumatic): Secondary | ICD-10-CM | POA: Diagnosis not present

## 2023-10-02 DIAGNOSIS — Y929 Unspecified place or not applicable: Secondary | ICD-10-CM | POA: Diagnosis not present

## 2023-10-02 DIAGNOSIS — K9589 Other complications of other bariatric procedure: Secondary | ICD-10-CM | POA: Diagnosis not present

## 2023-10-02 DIAGNOSIS — Y838 Other surgical procedures as the cause of abnormal reaction of the patient, or of later complication, without mention of misadventure at the time of the procedure: Secondary | ICD-10-CM | POA: Diagnosis not present

## 2023-10-09 ENCOUNTER — Telehealth: Payer: Self-pay

## 2023-10-09 NOTE — Transitions of Care (Post Inpatient/ED Visit) (Addendum)
10/09/2023  Name: Morgan Burgess MRN: [id number] DOB: Jan 08, 1981  Today's TOC FU Call Status: Today's TOC FU Call Status:: Successful TOC FU Call Completed TOC FU Call Complete Date: 10/09/23 Patient's Name and Date of Birth confirmed.  Transition Care Management Follow-up Telephone Call Date of Discharge: 10/06/23 Discharge Facility: Other [facility]) Name of Other (Non[facility]) Discharge Facility: [facility] Type of Discharge: Inpatient Admission Primary Inpatient Discharge Diagnosis:: Hypokalemia How have you been since you were released from the hospital?: Better Any questions or concerns?: Yes Patient Questions/Concerns:: Wondering about dsg to GT site and if she needs to flush GT Patient Questions/Concerns Addressed:  (Call placed to Gen surgery for follow-up [phone] Spoke with Maddie, Message sent to Dr Cliffton Asters. Office will call patient back with GT instructions)  Items Reviewed: Did you receive and understand the discharge instructions provided?: No (as noted above) Medications obtained,verified, and reconciled?: Yes (Medications Reviewed) Any new allergies since your discharge?: No Dietary orders reviewed?: Yes Type of Diet Ordered:: Full liquid x 1 week then advance to puree Do you have support at home?: Yes People in Home: spouse Name of Support/Comfort Primary Source: Doug  Medications Reviewed Today: Medications Reviewed Today     Reviewed by Johnnette Barrios, RN (Registered Nurse) on 10/09/23 at 1533  Med List Status: <None>   Medication Order Taking? Sig Documenting Provider Last Dose Status Informant  acarbose (PRECOSE) 25 MG tablet [id number] No Take 25 mg by mouth 2 (two) times daily.  Patient not taking: Reported on 10/09/2023   [provider] Not Taking Active   ALPRAZolam Prudy Feeler) 0.5 MG tablet [id number] Yes Take 1 tablet (0.5 mg total) by mouth 2 (two) times daily as needed for anxiety. Sheliah Hatch, MD Taking Active   Continuous  Glucose Sensor (FREESTYLE LIBRE 3 PLUS SENSOR) [place] [id number] Yes by Does not apply route. Change sensor every 15 days. [provider] Taking Active   FLUoxetine (PROZAC) 40 MG capsule [id number] Yes Take 1 capsule (40 mg total) by mouth daily. Sheliah Hatch, MD Taking Active   Glucagon (GVOKE HYPOPEN 2-PACK) 1 MG/0.2ML Ivory Broad [id number] Yes Inject once into the skin as needed for severe hypoglycemia  Taking Active   HYDROcodone-acetaminophen (NORCO/VICODIN) 5-325 MG tablet [id number] Yes Take 1 tablet by mouth every 6 (six) hours as needed for moderate pain (pain score 4-6). [provider]  Active   Ibuprofen-Famotidine (DUEXIS) 800-26.6 MG TABS [id number] Yes Take 1 tablet by mouth 3 (three) times daily. Sheliah Hatch, MD Taking Active   levocetirizine Elita Boone) 5 MG tablet [id number] Yes TAKE 1 TABLET EVERY EVENING Sheliah Hatch, MD Taking Active   levonorgestrel (MIRENA) 20 MCG/24HR IUD [id number] Yes 1 each by Intrauterine route continuous. [provider] Taking Active            Med Note Erenest Rasher   Sat Aug 01, 2015 10:12 PM) Start date 12/2014  ondansetron (ZOFRAN-ODT) 4 MG disintegrating tablet [id number] Yes Take 4 mg by mouth every 8 (eight) hours as needed for nausea or vomiting. [provider] Taking Active   Semaglutide-Weight Management [facility]) 0.5 MG/0.5ML Ivory Broad [id number] Yes Inject 0.5 mg into the skin once a week. Sheliah Hatch, MD Taking Active   Semaglutide-Weight Management [facility]) 0.5 MG/0.5ML Ivory Broad [id number] Yes Inject 0.5 mg into the skin once a week.  Taking Active   zolpidem (AMBIEN) 10 MG tablet [id number] Yes Take 1 tablet (10 mg total) by mouth at bedtime. Sheliah Hatch, MD Taking Active  Home Care and Equipment/Supplies: Were Home Health Services Ordered?: No EMR reviewed for Home Health Orders: Home Health Not Ordered Any new equipment or medical supplies ordered?: Yes Name of Medical  supply agency?: [facility] Were you able to get the equipment/medical supplies?: Yes Do you have any questions related to the use of the equipment/supplies?: No  Functional Questionnaire: Do you need assistance with bathing/showering or dressing?: No Do you need assistance with meal preparation?: No Do you need assistance with eating?: No (Pending questions r/t GT) Do you have difficulty maintaining continence: No Do you need assistance with getting out of bed/getting out of a chair/moving?: No Do you have difficulty managing or taking your medications?: No  Follow up appointments reviewed: PCP Follow-up appointment confirmed?: Yes Date of PCP follow-up appointment?: 10/16/23 Follow-up Provider: Neena Rhymes Post op and annual wellness Specialist Hospital Follow-up appointment confirmed?: Yes Date of Specialist follow-up appointment?: 10/12/23 Follow-Up Specialty Provider:: Dr Cliffton Asters Do you need transportation to your follow-up appointment?: No Do you understand care options if your condition(s) worsen?: Yes-patient verbalized understanding    Goals Addressed             This Visit's Progress    TOC Care Plan       Current Barriers:  Care Coordination with VBCI CM s/p Hospitalization w/ subsequent surgery and GT placement   RNCM Clinical Goal(s):  Patient will work with the Care Management team over the next 30 days to address Transition of Care Barriers: Medication Management Provider appointments attend all scheduled medical appointments: with PCP and Surgeon as evidenced by no missed appointments  demonstrate Ongoing health management independence as evidenced by no missed appointments , no missed medications, follow-up care as directed , no adverse side effects or ER visits for uncontrolled s/s   through collaboration with RN Care manager, provider, and care team.   Interventions: Evaluation of current treatment plan related to  self management and patient's  adherence to plan as established by provider  Transitions of Care:  New goal. Doctor Visits  - discussed the importance of doctor visits Contacted provider for patient needs regarding GT site care and GT management including flushes   Patient Goals/Self-Care Activities: Participate in Transition of Care Program/Attend TOC scheduled calls Take all medications as prescribed Attend all scheduled provider appointments Perform all self care activities independently  Perform IADL's (shopping, preparing meals, housekeeping, managing finances) independently Call provider office for new concerns or questions   Follow Up Plan:  The patient has been provided with contact information for the care management team and has been advised to call with any health related questions or concerns.  The care management team will reach out to the patient again over the next 10 days.         F/U 10/17/23 @ 10am  Susa Loffler , BSN, RN Care Management Coordinator [place]   [facility] [email or web address] Direct Dial: [phone]

## 2023-10-11 ENCOUNTER — Ambulatory Visit: Payer: BC Managed Care – PPO | Admitting: Psychology

## 2023-10-12 DIAGNOSIS — Z9049 Acquired absence of other specified parts of digestive tract: Secondary | ICD-10-CM | POA: Diagnosis not present

## 2023-10-12 DIAGNOSIS — Z9889 Other specified postprocedural states: Secondary | ICD-10-CM | POA: Diagnosis not present

## 2023-10-12 DIAGNOSIS — Z09 Encounter for follow-up examination after completed treatment for conditions other than malignant neoplasm: Secondary | ICD-10-CM | POA: Diagnosis not present

## 2023-10-13 ENCOUNTER — Ambulatory Visit: Payer: BC Managed Care – PPO | Admitting: Psychology

## 2023-10-16 ENCOUNTER — Encounter: Payer: Self-pay | Admitting: Family Medicine

## 2023-10-16 ENCOUNTER — Ambulatory Visit (INDEPENDENT_AMBULATORY_CARE_PROVIDER_SITE_OTHER): Payer: BC Managed Care – PPO | Admitting: Family Medicine

## 2023-10-16 VITALS — BP 92/62 | HR 84 | Temp 98.0°F | Ht 66.0 in | Wt 174.2 lb

## 2023-10-16 DIAGNOSIS — Z0000 Encounter for general adult medical examination without abnormal findings: Secondary | ICD-10-CM | POA: Diagnosis not present

## 2023-10-16 DIAGNOSIS — G4700 Insomnia, unspecified: Secondary | ICD-10-CM

## 2023-10-16 DIAGNOSIS — Z1231 Encounter for screening mammogram for malignant neoplasm of breast: Secondary | ICD-10-CM

## 2023-10-16 DIAGNOSIS — D508 Other iron deficiency anemias: Secondary | ICD-10-CM

## 2023-10-16 DIAGNOSIS — E559 Vitamin D deficiency, unspecified: Secondary | ICD-10-CM

## 2023-10-16 DIAGNOSIS — E669 Obesity, unspecified: Secondary | ICD-10-CM | POA: Insufficient documentation

## 2023-10-16 DIAGNOSIS — E663 Overweight: Secondary | ICD-10-CM

## 2023-10-16 MED ORDER — ALPRAZOLAM 0.5 MG PO TABS: 0.5000 mg | ORAL_TABLET | Freq: Two times a day (BID) | ORAL | 1 refills | Status: DC | PRN

## 2023-10-16 MED ORDER — ZOLPIDEM TARTRATE 10 MG PO TABS: 10.0000 mg | ORAL_TABLET | Freq: Every day | ORAL | 0 refills | Status: DC

## 2023-10-16 MED ORDER — FLUOXETINE HCL 40 MG PO CAPS: 40.0000 mg | ORAL_CAPSULE | Freq: Every day | ORAL | 3 refills | Status: DC

## 2023-10-16 NOTE — Progress Notes (Signed)
   Subjective:    Patient ID: Morgan Burgess, female    DOB: [birth year], 41 y.o.   MRN: [id number]  HPI CPE- pt reports feeling well.  Had to reschedule pap, needs to start regular mammograms.  Pt had Gtube placed 10/7.  Patient Care Team    Relationship Specialty Notifications Start End  Sheliah Hatch, MD PCP - General Family Medicine  12/29/21   Davis Gourd, MD Referring Physician Obstetrics  05/12/23     Health Maintenance  Topic Date Due   Cervical Cancer Screening (HPV/Pap Cotest)  03/22/2018   INFLUENZA VACCINE  07/27/2023   HPV VACCINES  Aged Out   DTaP/Tdap/Td  Discontinued   COVID-19 Vaccine  Discontinued   Hepatitis C Screening  Discontinued   HIV Screening  Discontinued      Review of Systems Patient reports no vision/ hearing changes, adenopathy,fever, weight change,  persistant/recurrent hoarseness , swallowing issues, chest pain, palpitations, edema, persistant/recurrent cough, hemoptysis, dyspnea (rest/exertional/paroxysmal nocturnal), gastrointestinal bleeding (melena, rectal bleeding), abdominal pain, significant heartburn, bowel changes, GU symptoms (dysuria, hematuria, incontinence), Gyn symptoms (abnormal  bleeding, pain),  syncope, focal weakness, memory loss, numbness & tingling, skin/hair/nail changes, abnormal bruising or bleeding, anxiety, or depression.     Objective:   Physical Exam General Appearance:    Alert, cooperative, no distress, appears stated age  Head:    Normocephalic, without obvious abnormality, atraumatic  Eyes:    PERRL, conjunctiva/corneas clear, EOM's intact both eyes  Ears:    Normal TM's and external ear canals, both ears  Nose:   Nares normal, septum midline, mucosa normal, no drainage    or sinus tenderness  Throat:   Lips, mucosa, and tongue normal; teeth and gums normal  Neck:   Supple, symmetrical, trachea midline, no adenopathy;    Thyroid: no enlargement/tenderness/nodules  Back:     Symmetric, no curvature, ROM normal,  no CVA tenderness  Lungs:     Clear to auscultation bilaterally, respirations unlabored  Chest Wall:    No tenderness or deformity   Heart:    Regular rate and rhythm, S1 and S2 normal, no murmur, rub   or gallop  Breast Exam:    Deferred to GYN  Abdomen:     Surgical binder in place  Genitalia:    Deferred to GYN  Rectal:    Extremities:   Extremities normal, atraumatic, no cyanosis or edema  Pulses:   2+ and symmetric all extremities  Skin:   Skin color, texture, turgor normal, no rashes or lesions  Lymph nodes:   Cervical, supraclavicular, and axillary nodes normal  Neurologic:   CNII-XII intact, normal strength, sensation and reflexes    throughout          Assessment & Plan:

## 2023-10-16 NOTE — Patient Instructions (Signed)
Follow up in 1 year- sooner if needed Schedule a lab visit at your convenience- orders are in Mammogram is ordered- you can schedule when ready Call with any questions or concerns Stay Safe!  Stay Healthy! Hang in there!!!

## 2023-10-16 NOTE — Assessment & Plan Note (Signed)
Pt's PE WNL w/ exception of surgical binder and newly placed G tube.  Due for mammo- ordered.  Pt to reschedule pap.  Will get labs in a few months as she just had them done for surgery.  Anticipatory guidance provided.

## 2023-10-16 NOTE — Assessment & Plan Note (Signed)
Check labs and replete iron prn (future order)

## 2023-10-16 NOTE — Assessment & Plan Note (Signed)
Check labs and replete prn (future order)

## 2023-10-17 ENCOUNTER — Telehealth: Payer: Self-pay

## 2023-10-17 ENCOUNTER — Other Ambulatory Visit: Payer: Self-pay

## 2023-10-17 NOTE — Patient Outreach (Signed)
  Care Management  Transitions of Care Program Transitions of Care Post-discharge week 2  10/17/2023 Name: Morgan Burgess MRN: [id number] DOB: 10-Aug-1981  Subjective: Morgan Burgess is a 42 y.o. year old female who is a primary care patient of Tabori, Helane Rima, MD. The Care Management team was unable to reach the patient by phone to assess and address week 2 of TOC Program   Plan: Additional outreach attempts will be made to reach the patient enrolled in the [facility] Program (Post Inpatient/ED Visit).  Susa Loffler , BSN, RN Care Management Coordinator [place]   [facility] [email or web address] Direct Dial: [phone]

## 2023-10-18 ENCOUNTER — Telehealth: Payer: Self-pay

## 2023-10-18 ENCOUNTER — Telehealth: Payer: Self-pay | Admitting: Family Medicine

## 2023-10-18 NOTE — Telephone Encounter (Signed)
Can /will you write an order for these supplies as requested?

## 2023-10-18 NOTE — Patient Outreach (Signed)
call placed to PCP office , Spoke with Erie Noe.Patient needed some supplies ordered . Requested 4x4 split gauze and 60 ml syringes for GT flushes  Preferred DME is [employer] [phone].  Message sent to PROVIDER   Susa Loffler , BSN, RN Care Management Coordinator [facility]   [facility] [email or web address] Direct Dial: [phone]

## 2023-10-18 NOTE — Telephone Encounter (Signed)
Since I don't do PEG tube management, this order needs to come from the surgeon

## 2023-10-18 NOTE — Telephone Encounter (Signed)
Called case manager and she will be calling the surgeon

## 2023-10-18 NOTE — Telephone Encounter (Signed)
Ann Held [facility] Case Mgr  [facility] [place] [phone] Pt needs DME [facility]) for feeding tube flushing   Split guaze 4x4  60mL syringe

## 2023-10-18 NOTE — Patient Outreach (Signed)
Care Management  Transitions of Care Program Transitions of Care Post-discharge week 3   10/18/2023 Name: Morgan Burgess MRN: [id number] DOB: 23-Sep-1981  Subjective: Morgan Burgess is a 42 y.o. year old female who is a primary care patient of Tabori, Helane Rima, MD. The Care Management team Engaged with patient Engaged with patient by telephone to assess and address transitions of care needs.   Consent to Services:  Patient was given information about care management services, agreed to services, and gave verbal consent to participate.   Assessment:     Patient voices no new complaints Patient has not developed/ reported any new Medical issues / Dx or acute changes.- since last follow-up call for most recent  Hospital stay  10/7-10/11 / 2024  She is doing very well, Seen by PCP 10/21 No changes She has surgical follow-up today . She is flushing GT with 30-40 ml H2O daily, She is in need of supplies - Call placed to PCP office  today. Per surgeon she will need temp GT for 3-4 months. She has advanced t a soft diet and is tolerating this well. She is having reg BM's w/ Colace for a stool softener. Her incision is c/d, with steri-strips , no s/s infection.  with anticipated removal Jan 2025.  Patient educated on red flags s/s to watch for and was encouraged to report any of these identified , any new symptoms , changes in baseline or  medication regimen,  change in health status  /  well-being, or safety concerns to PCP and / or the  VBCI Case Management team .        SDOH Interventions    Flowsheet Row Office Visit from [facility]/13/2023 in [facility] at [facility]  SDOH Interventions   Depression Interventions/Treatment  Medication, Currently on Treatment        Goals Addressed             This Visit's Progress    TOC Care Plan       Current Barriers:  Care Coordination with VBCI CM s/p Hospitalization w/ subsequent surgery and GT placement   RNCM Clinical  Goal(s):  Patient will work with the Care Management team over the next 30 days to address Transition of Care Barriers: Medication Management Provider appointments attend all scheduled medical appointments: with PCP and Surgeon as evidenced by no missed appointments  demonstrate Ongoing health management independence as evidenced by no missed appointments , no missed medications, follow-up care as directed , no adverse side effects or ER visits for uncontrolled s/s   through collaboration with RN Care manager, provider, and care team.   Interventions: Evaluation of current treatment plan related to  self management and patient's adherence to plan as established by provider  Transitions of Care:  Goal on track:  Yes. Doctor Visits  - discussed the importance of doctor visits Contacted provider for patient needs regarding GT site care and GT management including flushes- flushes 30-40 ml every day- she needs supplies    Patient Goals/Self-Care Activities: Participate in Transition of Care Program/Attend TOC scheduled calls Take all medications as prescribed Attend all scheduled provider appointments Perform all self care activities independently  Perform IADL's (shopping, preparing meals, housekeeping, managing finances) independently Call provider office for new concerns or questions   Follow Up Plan:  Telephone follow up appointment with care management team member scheduled for:  10/25/23 @ 11:am The patient has been provided with contact information for the care management team and has  been advised to call with any health related questions or concerns.          Plan: The patient has been provided with contact information for the care management team and has been advised to call with any health related questions or concerns.  Routine follow-up and on-going assessment evaluation and education of disease processes, recommended interventions for both chronic and acute medical conditions ,  will occur during each  visit along with ongoing  management of symptoms ,medication reviews and reconciliation. Will make changes and updates episodically to Care Plan and initiate visits / follow-up as indicated .   Patient provided with Contact information for VBCI CM   Reviewed goals for care , patient   verbalized understanding and agreement with current POC.   Susa Loffler , BSN, RN Care Management Coordinator [place]   [facility] [email or web address] Direct Dial: [phone]

## 2023-10-18 NOTE — Transitions of Care (Post Inpatient/ED Visit) (Signed)
   10/18/2023  Name: Marika Bells MRN: [id number] DOB: 21-Jul-1981  Received call from Dr Beverely Low clinical staff regarding request fir TF management. Per Nurse PCP is unable to place order for supplies and deferred  to surgeon. Call placed to Dr Cliffton Asters , spoke with Renea Ee. Requested supplies be ordered provided contact info for [employer] [phone]) and need for split 4x4 gauze and 60 ml syringes . They will call in supply order   Susa Loffler , BSN, RN Care Management Coordinator [facility]   [facility] [email or web address] Direct Dial: [phone]

## 2023-10-20 ENCOUNTER — Ambulatory Visit: Payer: BC Managed Care – PPO | Admitting: Family Medicine

## 2023-10-25 ENCOUNTER — Other Ambulatory Visit: Payer: Self-pay

## 2023-10-25 ENCOUNTER — Telehealth: Payer: Self-pay

## 2023-10-25 DIAGNOSIS — Z9049 Acquired absence of other specified parts of digestive tract: Secondary | ICD-10-CM | POA: Diagnosis not present

## 2023-10-25 DIAGNOSIS — Z556 Problems related to health literacy: Secondary | ICD-10-CM | POA: Diagnosis not present

## 2023-10-25 DIAGNOSIS — R1031 Right lower quadrant pain: Secondary | ICD-10-CM | POA: Diagnosis not present

## 2023-10-25 DIAGNOSIS — K6389 Other specified diseases of intestine: Secondary | ICD-10-CM | POA: Diagnosis not present

## 2023-10-25 DIAGNOSIS — R110 Nausea: Secondary | ICD-10-CM | POA: Diagnosis not present

## 2023-10-25 DIAGNOSIS — R1033 Periumbilical pain: Secondary | ICD-10-CM | POA: Diagnosis not present

## 2023-10-25 DIAGNOSIS — Z8719 Personal history of other diseases of the digestive system: Secondary | ICD-10-CM | POA: Diagnosis not present

## 2023-10-25 DIAGNOSIS — Z931 Gastrostomy status: Secondary | ICD-10-CM | POA: Diagnosis not present

## 2023-10-25 DIAGNOSIS — Z9884 Bariatric surgery status: Secondary | ICD-10-CM | POA: Diagnosis not present

## 2023-10-25 DIAGNOSIS — K561 Intussusception: Secondary | ICD-10-CM | POA: Diagnosis not present

## 2023-10-25 LAB — LAB REPORT - SCANNED: EGFR: 90

## 2023-10-25 NOTE — Patient Outreach (Signed)
Care Management  Transitions of Care Program Transitions of Care Post-discharge week 3   10/25/2023 Name: Morgan Burgess MRN: [id number] DOB: [birth year]  Subjective: Morgan Burgess is a 42 y.o. year old female who is a primary care patient of Tabori, Helane Rima, MD. The Care Management team Engaged with patient Engaged with patient by telephone to assess and address transitions of care needs.   Consent to Services:  Patient was given information about care management services, agreed to services, and gave verbal consent to participate.   Assessment:   Patient voices new concerns and developed an open area at the GT site. Per conversation this may be due to the port resting against her abdominal. She notes it is green and brown , has a foul odor and volume varies. She cleans area and has applied dressing . She has been unable to get supplies form PCP, Surgeon or DME company [employer] (consulting))  despite call and attempts.  most recent Hospital stay   10/7-10/11/ 2024 . She also notes drainage in bag has increased from approx 50 ml to 150 thick, yellow and bile colored . She is still on soft diet, tolerating well. She has had trouble with occ constipation She  has resumed Colace and Senna, She may have BM every day or every other day. She states abd is soft, not firm. 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 She self scheduled an appt with the surgeon for 10/26/23 due to noted changes  Patient educated on red flags s/s to watch for and was encouraged to report any of these identified , any new symptoms , changes in baseline or  medication regimen,  change in health status  /  well-being, or safety concerns to PCP and / or the  VBCI Case Management team .          SDOH Interventions    Flowsheet Row Telephone from 10/25/2023 in [place] [facility]PARTMENT Office Visit from 04/13/2023 in [facility]  SDOH Interventions    Housing Interventions  Intervention Not Indicated --  Depression Interventions/Treatment  -- Medication, Currently on Treatment        Goals Addressed             This Visit's Progress    TOC Care Plan       Current Barriers:  Care Coordination with VBCI CM s/p Hospitalization w/ subsequent surgery and GT placement   RNCM Clinical Goal(s):  Patient will work with the Care Management team over the next 30 days to address Transition of Care Barriers: Medication Management Provider appointments attend all scheduled medical appointments: with PCP and Surgeon as evidenced by no missed appointments  demonstrate Ongoing health management independence as evidenced by no missed appointments , no missed medications, follow-up care as directed , no adverse side effects or ER visits for uncontrolled s/s   through collaboration with RN Care manager, provider, and care team.   Interventions: Evaluation of current treatment plan related to  self management and patient's adherence to plan as established by provider  Transitions of Care:  Goal on track:  Yes. Doctor Visits  - discussed the importance of doctor visits Contacted provider for patient needs regarding GT site care and GT management including flushes- flushes 30-40 ml every day- she needs supplies- this is still an issue Multiple providers have been contacted .     Patient Goals/Self-Care Activities: Participate in Transition of Care Program/Attend TOC scheduled  calls Take all medications as prescribed Attend all scheduled provider appointments Perform all self care activities independently  Perform IADL's (shopping, preparing meals, housekeeping, managing finances) independently Call provider office for new concerns or questions   Follow Up Plan:  Telephone follow up appointment with care management team member scheduled for:  10/30/23 @ 11:am She self scheduled an appt with surgeon for 10/26/23 due to changes in GT site and drainage  The patient has been  provided with contact information for the care management team and has been advised to call with any health related questions or concerns.          Plan: The patient has been provided with contact information for the care management team and has been advised to call with any health related questions or concerns.  Routine follow-up and on-going assessment evaluation and education of disease processes, recommended interventions for both chronic and acute medical conditions , will occur during each weekly visit along with ongoing review of symptoms ,medication reviews and reconciliation. Any updates , inconsistencies, discrepancies or acute care concerns will be addressed and routed to the correct Practitioner if indicated   Please refer to Care Plan for goals and interventions -Effectiveness of interventions, symptom management and outcomes will be evaluated  weekly during [facility] 30-day Program Outreach calls  . Any necessary  changes and updates to Care Plan will be completed episodically    Reviewed goals for care  Patient provided with Contact information and verbalized understanding with current POC.   Susa Loffler , BSN, RN Care Management Coordinator [place]   [facility] [email or web address] Direct Dial: [phone]

## 2023-10-26 DIAGNOSIS — Z931 Gastrostomy status: Secondary | ICD-10-CM | POA: Insufficient documentation

## 2023-10-26 DIAGNOSIS — Z9889 Other specified postprocedural states: Secondary | ICD-10-CM | POA: Insufficient documentation

## 2023-10-30 ENCOUNTER — Telehealth: Payer: Self-pay

## 2023-10-30 ENCOUNTER — Other Ambulatory Visit: Payer: Self-pay

## 2023-10-30 DIAGNOSIS — H00021 Hordeolum internum right upper eyelid: Secondary | ICD-10-CM | POA: Diagnosis not present

## 2023-10-30 NOTE — Patient Outreach (Signed)
Care Management  Transitions of Care Program Transitions of Care Post-discharge week 4   10/30/2023 Name: Morgan Burgess MRN: [id number] DOB: [birth year]  Subjective: Morgan Burgess is a 42 y.o. year old female who is a primary care patient of Tabori, Helane Rima, MD. The Care Management team Engaged with patient Engaged with patient by telephone to assess and address transitions of care needs.   Consent to Services:  Patient was given information about care management services, agreed to services, and gave verbal consent to participate.   Assessment:  Most recent Hospital stay    10/7-10/11  / 2024  ER Visit 10/25/23 Patient developed/ reported  new Medical issues - acute changes.- since last follow-up call. She was havig increased abdominal pain, called surgeon office x 2 ( 2pm and 4:45pm) She was unable to speak  with surgeon and was advised to go to the ER for evaluation. Please refer to ER visit summary for details. She reports he abdominal pain is much improved, she is up and about.  She continues to have small amts of yellow/ green drainage around GT site, She has not been able to get a provider to sign order form for sullies and has not received them from [employer]. She continues to advance her diet still tolerating soft foods and plans to advance to regular food within the net week.  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          SDOH Interventions    Flowsheet Row Telephone from 10/25/2023 in [place] POPULATION HEALTH DEPARTMENT Office Visit from 04/13/2023 in [facility]  SDOH Interventions     Housing Interventions Intervention Not Indicated --  Depression Interventions/Treatment  -- Medication, Currently on Treatment        Goals Addressed             This Visit's Progress    TOC Care Plan       Current Barriers:  Care Coordination with VBCI CM s/p Hospitalization w/ subsequent surgery and GT placement   RNCM Clinical Goal(s):  Patient will work with the Care Management team over the next 30 days to address Transition of Care Barriers: Medication Management Provider appointments attend all scheduled medical appointments: with PCP and Surgeon as evidenced by no missed appointments  demonstrate Ongoing health management independence as evidenced by no missed appointments , no missed medications, follow-up care as directed , no adverse side effects or ER visits for uncontrolled s/s   through collaboration with RN Care manager, provider, and care team.   Interventions: Evaluation of current treatment plan related to  self management and patient's adherence to plan as established by provider  Transitions of Care:  Goal on track:  Yes. Doctor Visits  - discussed the importance of doctor visits Contacted provider for patient needs regarding GT site care and GT management including flushes- flushes 30-40 ml every day- she needs supplies- this is still an issue Multiple providers have been contacted .     Patient Goals/Self-Care Activities: Participate in Transition of Care Program/Attend  TOC scheduled calls Take all medications as prescribed Attend all scheduled provider appointments Perform all self care activities independently  Perform IADL's (shopping, preparing meals, housekeeping, managing finances) independently Call provider office for new concerns or questions   Follow Up Plan:  Telephone follow up appointment with care management team member scheduled for:  11/09/23 @ 10:00am She ended up in ER 10/30 for abd pain.  The patient has been provided with contact  information for the care management team and has been advised to call with any health related questions or concerns.          Plan: The patient has been provided with contact information for the care management team and has been advised to call with any health related questions or concerns.  Routine follow-up and on-going assessment evaluation and education of disease processes, recommended interventions for both chronic and acute medical conditions , will occur during each weekly visit along with ongoing review of symptoms ,medication reviews and reconciliation. Any updates , inconsistencies, discrepancies or acute care concerns will be addressed and routed to the correct Practitioner if indicated   Please refer to Care Plan for goals and interventions -Effectiveness of interventions, symptom management and outcomes will be evaluated  weekly during [facility] 30-day Program Outreach calls  . Any necessary  changes and updates to Care Plan will be completed episodically    Reviewed goals for care  Patient provided with Contact information and verbalized understanding with current POC.   Susa Loffler , BSN, RN Care Management Coordinator [place]   [facility] [email or web address] Direct Dial: [phone]

## 2023-11-02 ENCOUNTER — Telehealth: Payer: Self-pay | Admitting: *Deleted

## 2023-11-02 NOTE — Progress Notes (Signed)
  Care Coordination Note  11/02/2023 Name: Morgan Burgess MRN: [id number] DOB: 07/11/1981  Morgan Burgess is a 42 y.o. year old female who is a primary care patient of Beverely Low, Helane Rima, MD and is actively engaged with the care management team. I reached out to Louis Matte by phone today to assist with re-scheduling a follow up visit with the  [facility] RN  Follow up plan: Unsuccessful telephone outreach attempt made. A HIPAA compliant phone message was left for the patient providing contact information and requesting a return call.   [facility]  Care Coordination Care Guide  Direct Dial: [phone]

## 2023-11-06 NOTE — Progress Notes (Unsigned)
  Care Coordination Note  11/06/2023 Name: Breshae Manieri MRN: [id number] DOB: 1981-11-18  Arnesha Mollison is a 42 y.o. year old female who is a primary care patient of Beverely Low, Helane Rima, MD and is actively engaged with the care management team. I reached out to Louis Matte by phone today to assist with re-scheduling a follow up visit with the  [facility] RN  Follow up plan: Unsuccessful telephone outreach attempt made. A HIPAA compliant phone message was left for the patient providing contact information and requesting a return call.   [facility]  Care Coordination Care Guide  Direct Dial: [phone]

## 2023-11-07 NOTE — Progress Notes (Signed)
  Care Coordination Note  11/07/2023 Name: [name] MRN: [id number] DOB: Jan 01, 1981  Morgan Burgess is a 42 y.o. year old female who is a primary care patient of Beverely Low, Helane Rima, MD and is actively engaged with the care management team. I reached out to Louis Matte by phone today to assist with re-scheduling a follow up visit with the  [facility] RN.  Follow up plan: Unsuccessful telephone outreach attempt made. A HIPAA compliant phone message was left for the patient providing contact information and requesting a return call. We have been unable to make contact with the patient for follow up. The care management team is available to follow up with the patient after provider conversation with the patient regarding recommendation for care management engagement and subsequent re-referral to the care management team.   [facility] Coordination Care Guide  Direct Dial: [phone]

## 2023-11-09 ENCOUNTER — Other Ambulatory Visit: Payer: BC Managed Care – PPO

## 2023-11-09 ENCOUNTER — Telehealth: Payer: Self-pay

## 2023-11-09 DIAGNOSIS — Z9889 Other specified postprocedural states: Secondary | ICD-10-CM | POA: Diagnosis not present

## 2023-11-09 DIAGNOSIS — Z9049 Acquired absence of other specified parts of digestive tract: Secondary | ICD-10-CM | POA: Diagnosis not present

## 2023-11-09 DIAGNOSIS — Z09 Encounter for follow-up examination after completed treatment for conditions other than malignant neoplasm: Secondary | ICD-10-CM | POA: Diagnosis not present

## 2023-11-09 DIAGNOSIS — Z931 Gastrostomy status: Secondary | ICD-10-CM | POA: Diagnosis not present

## 2023-11-09 NOTE — Patient Outreach (Signed)
Care Management  Transitions of Care Program Transitions of Care Post-discharge Program Completion   11/09/2023 Name: Morgan Burgess MRN: [id number] DOB: Jun 18, 1981  Subjective: Morgan Burgess is a 42 y.o. year old female who is a primary care patient of Tabori, Helane Rima, MD. The Care Management team Had a scheduled appt, patient requested it to be rescheduled. Care Guides attempted to reschedule appt. Call placed today Patient has 5 week post-op appt.   to assess and address transitions of care needs.   Consent to Services:  Patient was given information about care management services, agreed to services, and gave verbal consent to participate.   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 Based on chart review She may Continue diet as tolerated.  She has been slowly progressing diet from liq to puree to soft to full  -She has a follow-up in  December for g-tube removal,, may be able to have it removed sooner  . She was cleared to return to work by surgery ad was told to  use best judgement with lifting. 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           SDOH Interventions    Flowsheet Row Telephone from 10/25/2023 in [place] POPULATION HEALTH DEPARTMENT Office Visit from 04/13/2023 in [facility]  SDOH Interventions    Housing Interventions Intervention Not Indicated --  Depression Interventions/Treatment  -- Medication, Currently on Treatment        Goals Addressed             This Visit's Progress    COMPLETED: TOC Care Plan       Current Barriers:  Care Coordination with VBCI CM s/p Hospitalization w/ subsequent surgery and GT placement   RNCM Clinical Goal(s):  Patient will work with the Care Management team over the next 30 days to address Transition of Care Barriers: Medication Management Provider appointments attend all scheduled medical appointments: with PCP and Surgeon as evidenced by no missed appointments  demonstrate Ongoing health management independence as evidenced by no missed appointments , no missed medications, follow-up care as directed , no adverse side effects or ER visits for uncontrolled s/s   through collaboration with RN Care manager, provider, and care team.   Interventions: Evaluation of current treatment plan related to  self management and patient's adherence to plan as established by provider  Transitions of Care:  Goal Met. Doctor Visits  - discussed the importance of doctor visits Contacted provider for patient needs regarding GT site care and GT management including flushes- flushes  30-40 ml every day- she needs supplies- this is still an issue Multiple providers have been contacted .     Patient Goals/Self-Care Activities: Participate in Transition of Care Program/Attend TOC scheduled calls Take all medications as prescribed Attend all scheduled provider appointments Perform all self care activities independently  Perform IADL's (shopping, preparing meals, housekeeping, managing finances) independently Call provider office for new concerns or  questions   Follow Up Plan:  Telephone follow up appointment with care management team member scheduled for:  11/09/23 @ 10:00am She ended up in ER 10/30 for abd pain.Cancelled VBCI Care Guides attempted to reschedule   The patient has been provided with contact information for the care management team and has been advised to call with any health related questions or concerns.  No further follow up required: Program Completion. She had 5 week post op appt today            Plan:   The patient has successfully completed the 30-day TOC Program. Condition is stable No further acute needs identified at this time. Chronic conditions and ongoing care is  managed thru collaboration with  PCP,  Specialists and additional Healthcare Providers if indicated . Patient is a Engineer, civil (consulting) and has previously verbalized understanding of ongoing plan of care.  SDOH needs have been screened on prior calls and interventions provided if identified.   Previously discussed rationale of use, how/when to take medications. Patient is aware of potential side effects, and was encouraged to notify PCP for any changes in condition or signs / symptoms not relieved  with interventions.   Patient  can call 911 for Medical Emergencies or Life -Threatening or report to a local emergency department or urgent care.   Patient is aware to Contact PCP  with any questions or concerns regarding ongoing  medical care, any  difficulty obtaining or picking up  prescriptions, any  changes or  worsening in  condition including signs / symptoms not relieved  with interventions  The patient has been provided with contact information for the care management team and has been advised to call with any health related questions or concerns. During previous weekly calls  Susa Loffler , BSN, RN Care Management Coordinator [place]   [facility] [email or web address] Direct Dial: [phone]

## 2023-11-10 DIAGNOSIS — S31109A Unspecified open wound of abdominal wall, unspecified quadrant without penetration into peritoneal cavity, initial encounter: Secondary | ICD-10-CM | POA: Diagnosis not present

## 2023-12-05 ENCOUNTER — Ambulatory Visit (HOSPITAL_BASED_OUTPATIENT_CLINIC_OR_DEPARTMENT_OTHER)
Admission: RE | Admit: 2023-12-05 | Discharge: 2023-12-05 | Disposition: A | Discharge status: Home / Self Care | Payer: BC Managed Care – PPO | Source: Ambulatory Visit | Attending: Family Medicine | Admitting: Family Medicine

## 2023-12-05 ENCOUNTER — Other Ambulatory Visit: Payer: Self-pay | Admitting: Family Medicine

## 2023-12-05 ENCOUNTER — Encounter (HOSPITAL_BASED_OUTPATIENT_CLINIC_OR_DEPARTMENT_OTHER): Payer: Self-pay | Admitting: Radiology

## 2023-12-05 DIAGNOSIS — Z1231 Encounter for screening mammogram for malignant neoplasm of breast: Secondary | ICD-10-CM | POA: Diagnosis not present

## 2023-12-06 ENCOUNTER — Telehealth: Payer: Self-pay | Admitting: Family Medicine

## 2023-12-06 ENCOUNTER — Other Ambulatory Visit: Payer: Self-pay

## 2023-12-06 ENCOUNTER — Other Ambulatory Visit: Payer: Self-pay | Admitting: Family Medicine

## 2023-12-06 DIAGNOSIS — E559 Vitamin D deficiency, unspecified: Secondary | ICD-10-CM

## 2023-12-06 DIAGNOSIS — S31109A Unspecified open wound of abdominal wall, unspecified quadrant without penetration into peritoneal cavity, initial encounter: Secondary | ICD-10-CM | POA: Diagnosis not present

## 2023-12-06 DIAGNOSIS — E663 Overweight: Secondary | ICD-10-CM

## 2023-12-06 DIAGNOSIS — D508 Other iron deficiency anemias: Secondary | ICD-10-CM

## 2023-12-06 MED ORDER — ACARBOSE 25 MG PO TABS
25.0000 mg | ORAL_TABLET | Freq: Two times a day (BID) | ORAL | 1 refills | Status: DC
  Filled 2023-12-06: qty 60, 30d supply, fill #0

## 2023-12-06 MED ORDER — ONDANSETRON 4 MG PO TBDP
4.0000 mg | ORAL_TABLET | Freq: Three times a day (TID) | ORAL | 1 refills | Status: AC | PRN
  Filled 2023-12-06: qty 9, 3d supply, fill #0

## 2023-12-06 MED ORDER — FREESTYLE LIBRE 3 PLUS SENSOR MISC
1 refills | Status: DC
  Filled 2023-12-06: qty 6, 90d supply, fill #0

## 2023-12-06 NOTE — Telephone Encounter (Signed)
Called [facility] to clarify. Patient went to [facility] on the third floor to have her blood work done while she was there to get her mammogram and this is why the patient is asking to have her orders changed to [facility] instead of [facility] because she had them done at a [facility] location.

## 2023-12-06 NOTE — Telephone Encounter (Signed)
Pt reports she went to have the labs drawn from 10/16/2023. These have been ordered by Dr.Tabori

## 2023-12-06 NOTE — Telephone Encounter (Signed)
Pt has been notified.

## 2023-12-06 NOTE — Telephone Encounter (Signed)
Caller name: Marguetta Imel  On DPR?: Yes  Call back number: [phone] (mobile)  Provider they see: Sheliah Hatch, MD  Reason for call:   Pt states she lab work done yesterday at [facility] but order was from [facility] it needs to be from [facility]. Bloodwork is still at lab per pt not sure???

## 2023-12-06 NOTE — Telephone Encounter (Signed)
Please clarify with patient where she wants the labs sent- [facility] or [facility].  If she wants it to go to [facility] we will have to call the lab at [facility] and see if this is even possible.  Otherwise, I think [facility] lab uses [facility] as their default provider

## 2023-12-06 NOTE — Telephone Encounter (Signed)
Please advise on refills.  

## 2023-12-06 NOTE — Telephone Encounter (Signed)
Labs were reordered as future and as [employer]

## 2023-12-06 NOTE — Telephone Encounter (Signed)
Unfortunately I don't have any control over where the blood was sent as it was not drawn here.  You need to call the lab where it was drawn and ask if they can change it, but unfortunately this is something that typically needs to be stated prior to the blood draw

## 2023-12-07 ENCOUNTER — Encounter: Payer: Self-pay | Admitting: Family Medicine

## 2023-12-07 DIAGNOSIS — D508 Other iron deficiency anemias: Secondary | ICD-10-CM

## 2023-12-07 LAB — BASIC METABOLIC PANEL
BUN/Creatinine Ratio: 14 (ref 9–23)
BUN: 10 mg/dL (ref 6–24)
CO2: 22 mmol/L (ref 20–29)
Calcium: 9.2 mg/dL (ref 8.7–10.2)
Chloride: 103 mmol/L (ref 96–106)
Creatinine, Ser: 0.73 mg/dL (ref 0.57–1.00)
Glucose: 86 mg/dL (ref 70–99)
Potassium: 5.8 mmol/L — ABNORMAL HIGH (ref 3.5–5.2)
Sodium: 139 mmol/L (ref 134–144)
eGFR: 105 mL/min/{1.73_m2} (ref >59)

## 2023-12-07 LAB — CBC WITH DIFFERENTIAL/PLATELET
Basophils Absolute: 0.1 10*3/uL (ref 0.0–0.2)
Basos: 1 %
EOS (ABSOLUTE): 0.2 10*3/uL (ref 0.0–0.4)
Eos: 4 %
Hematocrit: 32.1 % — ABNORMAL LOW (ref 34.0–46.6)
Hemoglobin: 10.2 g/dL — ABNORMAL LOW (ref 11.1–15.9)
Immature Grans (Abs): 0 10*3/uL (ref 0.0–0.1)
Immature Granulocytes: 0 %
Lymphocytes Absolute: 2.3 10*3/uL (ref 0.7–3.1)
Lymphs: 42 %
MCH: 29.1 pg (ref 26.6–33.0)
MCHC: 31.8 g/dL (ref 31.5–35.7)
MCV: 92 fL (ref 79–97)
Monocytes Absolute: 0.6 10*3/uL (ref 0.1–0.9)
Monocytes: 11 %
Neutrophils Absolute: 2.3 10*3/uL (ref 1.4–7.0)
Neutrophils: 42 %
Platelets: 219 10*3/uL (ref 150–450)
RBC: 3.5 x10E6/uL — ABNORMAL LOW (ref 3.77–5.28)
RDW: 11.8 % (ref 11.7–15.4)
WBC: 5.4 10*3/uL (ref 3.4–10.8)

## 2023-12-07 LAB — IRON AND TIBC
Iron Saturation: 10 % — ABNORMAL LOW (ref 15–55)
Iron: 38 ug/dL (ref 27–159)
Total Iron Binding Capacity: 380 ug/dL (ref 250–450)
UIBC: 342 ug/dL (ref 131–425)

## 2023-12-07 LAB — LIPID PANEL
Chol/HDL Ratio: 2.3 {ratio} (ref 0.0–4.4)
Cholesterol, Total: 163 mg/dL (ref 100–199)
HDL: 71 mg/dL (ref >39)
LDL Chol Calc (NIH): 80 mg/dL (ref 0–99)
Triglycerides: 63 mg/dL (ref 0–149)
VLDL Cholesterol Cal: 12 mg/dL (ref 5–40)

## 2023-12-07 LAB — HEPATIC FUNCTION PANEL
ALT: 13 [IU]/L (ref 0–32)
AST: 20 [IU]/L (ref 0–40)
Albumin: 4.4 g/dL (ref 3.9–4.9)
Alkaline Phosphatase: 104 [IU]/L (ref 44–121)
Bilirubin Total: 0.3 mg/dL (ref 0.0–1.2)
Bilirubin, Direct: 0.11 mg/dL (ref 0.00–0.40)
Total Protein: 7 g/dL (ref 6.0–8.5)

## 2023-12-07 LAB — TSH: TSH: 1.44 u[IU]/mL (ref 0.450–4.500)

## 2023-12-07 LAB — MAGNESIUM: Magnesium: 2 mg/dL (ref 1.6–2.3)

## 2023-12-07 LAB — VITAMIN D 25 HYDROXY (VIT D DEFICIENCY, FRACTURES): Vit D, 25-Hydroxy: 21.4 ng/mL — ABNORMAL LOW (ref 30.0–100.0)

## 2023-12-07 LAB — FERRITIN: Ferritin: 8 ng/mL — ABNORMAL LOW (ref 15–150)

## 2023-12-07 NOTE — Telephone Encounter (Signed)
Patient has some questions following Lab result, there is not a comment from you at this time but pt seems to have questions about values.

## 2023-12-07 NOTE — Telephone Encounter (Signed)
Patient said she would like to go to [facility] or the location at [facility]? Please advise

## 2023-12-08 NOTE — Addendum Note (Signed)
Addended by: Sheliah Hatch on: 12/08/2023 07:49 AM   Modules accepted: Orders

## 2023-12-11 ENCOUNTER — Other Ambulatory Visit (HOSPITAL_COMMUNITY): Payer: Self-pay

## 2023-12-11 ENCOUNTER — Telehealth: Payer: Self-pay

## 2023-12-11 ENCOUNTER — Encounter: Payer: Self-pay | Admitting: Family Medicine

## 2023-12-11 MED ORDER — VITAMIN D3 50 MCG (2000 UT) PO CAPS: 2000.0000 [IU] | ORAL_CAPSULE | Freq: Every day | ORAL | 1 refills | Status: DC

## 2023-12-11 MED ORDER — VITAMIN D (ERGOCALCIFEROL) 1.25 MG (50000 UNIT) PO CAPS: 50000.0000 [IU] | ORAL_CAPSULE | ORAL | 0 refills | Status: DC

## 2023-12-11 NOTE — Telephone Encounter (Signed)
Vitamin D has been sent in to Pharmacy

## 2023-12-11 NOTE — Telephone Encounter (Signed)
-----   Message from Neena Rhymes sent at 12/11/2023  7:45 AM EST ----- Labs are stable and look good w/ a few exceptions-  1) Vit D is low.  Based on this, we need to start 50,000 units weekly x12 weeks in addition to daily OTC supplement of at least 2000 units.   2) your hemoglobin and iron studies are again low- indicating iron deficiency anemia.  We have already replaced the referral to hematology for an iron infusion.

## 2023-12-12 DIAGNOSIS — M25561 Pain in right knee: Secondary | ICD-10-CM | POA: Diagnosis not present

## 2023-12-13 ENCOUNTER — Ambulatory Visit: Payer: BC Managed Care – PPO | Admitting: Psychology

## 2023-12-14 DIAGNOSIS — Z931 Gastrostomy status: Secondary | ICD-10-CM | POA: Diagnosis not present

## 2023-12-14 DIAGNOSIS — Z9889 Other specified postprocedural states: Secondary | ICD-10-CM | POA: Diagnosis not present

## 2023-12-15 ENCOUNTER — Inpatient Hospital Stay: Payer: BC Managed Care – PPO

## 2023-12-15 ENCOUNTER — Inpatient Hospital Stay: Payer: BC Managed Care – PPO | Attending: Family | Admitting: Family

## 2023-12-15 VITALS — BP 128/71 | HR 69 | Temp 98.9°F | Resp 16 | Ht 66.0 in | Wt 195.0 lb

## 2023-12-15 VITALS — BP 129/66 | HR 74 | Temp 97.9°F | Resp 18

## 2023-12-15 DIAGNOSIS — D508 Other iron deficiency anemias: Secondary | ICD-10-CM | POA: Diagnosis not present

## 2023-12-15 DIAGNOSIS — K909 Intestinal malabsorption, unspecified: Secondary | ICD-10-CM

## 2023-12-15 DIAGNOSIS — Z9884 Bariatric surgery status: Secondary | ICD-10-CM

## 2023-12-15 DIAGNOSIS — M25561 Pain in right knee: Secondary | ICD-10-CM | POA: Diagnosis not present

## 2023-12-15 DIAGNOSIS — D509 Iron deficiency anemia, unspecified: Secondary | ICD-10-CM | POA: Insufficient documentation

## 2023-12-15 DIAGNOSIS — K912 Postsurgical malabsorption, not elsewhere classified: Secondary | ICD-10-CM | POA: Insufficient documentation

## 2023-12-15 MED ORDER — SODIUM CHLORIDE 0.9 % IV SOLN: Freq: Once | INTRAVENOUS | Status: AC

## 2023-12-15 MED ORDER — DIPHENHYDRAMINE HCL 25 MG PO CAPS
25.0000 mg | ORAL_CAPSULE | Freq: Once | ORAL | Status: AC
Start: 2023-12-15 — End: 2023-12-15
  Administered 2023-12-15: 25 mg via ORAL
  Filled 2023-12-15: qty 1

## 2023-12-15 MED ORDER — ACETAMINOPHEN 325 MG PO TABS
650.0000 mg | ORAL_TABLET | Freq: Once | ORAL | Status: AC
Start: 2023-12-15 — End: 2023-12-15
  Administered 2023-12-15: 650 mg via ORAL
  Filled 2023-12-15: qty 2

## 2023-12-15 MED ORDER — IRON SUCROSE 20 MG/ML IV SOLN
300.0000 mg | Freq: Once | INTRAVENOUS | Status: AC
  Administered 2023-12-15: 300 mg via INTRAVENOUS
  Filled 2023-12-15: qty 300

## 2023-12-15 NOTE — Progress Notes (Signed)
Hematology and Oncology Follow Up Visit  Morgan Burgess [id number] 02/18/1981 42 y.o. 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 She is symptomatic with fatigue.  Iron saturation 10% and ferritin is 8.  She had small bowel resection surgery in October and had her G-tube removed yesterday. She has recuperated nicely. No c/o pain at this time.  No fever, chills, n/v, cough, rash, dizziness, SOB, chest pain, palpitations or changes in bowel or bladder habits.  She has not noted any obvious blood loss. No abnormal bruising, no petechiae.  No falls or syncope.  Appetite and hydration are good. Weight is 195 lbs.   ECOG Performance Status: 1 - Symptomatic but completely ambulatory  Medications:  Allergies as of 12/15/2023       Reactions   Latex Hives        Medication List        Accurate as of December 15, 2023  8:40 AM. If you have any questions, ask your nurse or doctor.          acarbose 25 MG tablet Commonly known as: PRECOSE Take 1 tablet (25 mg total) by mouth 2 (two) times daily.   ALPRAZolam 0.5 MG tablet Commonly known as: XANAX Take 1 tablet (0.5 mg total) by mouth 2 (two) times daily as needed for anxiety.   FLUoxetine 40 MG capsule Commonly known as: PROZAC Take 1 capsule (40 mg total) by mouth daily.   FreeStyle Libre 3 Plus Sensor Misc Change sensor every 15 days   Gvoke HypoPen 2-Pack 1 MG/0.2ML Soaj Generic drug: Glucagon Inject once into the skin as needed for severe hypoglycemia   HYDROcodone-acetaminophen 5-325 MG tablet Commonly known as: NORCO/VICODIN Take 1 tablet by mouth every 6 (six) hours as needed for moderate pain (pain score 4-6).   Ibuprofen-Famotidine 800-26.6 MG Tabs Commonly known as: Duexis Take 1 tablet by mouth 3 (three) times  daily.   levocetirizine 5 MG tablet Commonly known as: XYZAL TAKE 1 TABLET EVERY EVENING   levonorgestrel 20 MCG/24HR IUD Commonly known as: MIRENA 1 each by Intrauterine route continuous.   ondansetron 4 MG disintegrating tablet Commonly known as: ZOFRAN-ODT Dissolve 1 tablet (4 mg total) by mouth every 8 (eight) hours as needed for nausea or vomiting.   Vitamin D (Ergocalciferol) 1.25 MG (50000 UNIT) Caps capsule Commonly known as: DRISDOL Take 1 capsule (50,000 Units total) by mouth every 7 (seven) days.   Vitamin D3 50 MCG (2000 UT) capsule Take 1 capsule (2,000 Units total) by mouth daily.   Wegovy 0.5 MG/0.5ML Soaj Generic drug: Semaglutide-Weight Management Inject 0.5 mg into the skin once a week.   Wegovy 0.5 MG/0.5ML Soaj Generic drug: Semaglutide-Weight Management Inject 0.5 mg into the skin once a week.   zolpidem 10 MG tablet Commonly known as: AMBIEN Take 1 tablet (10 mg total) by mouth at bedtime.        Allergies:  Allergies  Allergen Reactions   Latex Hives    Past Medical History, Surgical history, Social history, and Family History were reviewed and updated.  Review of Systems: All other 10 point review of systems is negative.   Physical Exam:  vitals were not taken for this visit.   Wt Readings from Last 3 Encounters:  10/16/23 174 lb  4 oz (79 kg)  05/26/23 193 lb (87.5 kg)  04/13/23 182 lb 2 oz (82.6 kg)    Ocular: Sclerae unicteric, pupils equal, round and reactive to light Ear-nose-throat: Oropharynx clear, dentition fair Lymphatic: No cervical or supraclavicular adenopathy Lungs no rales or rhonchi, good excursion bilaterally Heart regular rate and rhythm, no murmur appreciated Abd soft, nontender, positive bowel sounds MSK no focal spinal tenderness, no joint edema Neuro: non-focal, well-oriented, appropriate affect Breasts: Deferred   Lab Results  Component Value Date   WBC 5.4 12/06/2023   HGB 10.2 (L) 12/06/2023   HCT  32.1 (L) 12/06/2023   MCV 92 12/06/2023   PLT 219 12/06/2023   Lab Results  Component Value Date   FERRITIN 8 (L) 12/06/2023   IRON 38 12/06/2023   TIBC 380 12/06/2023   UIBC 342 12/06/2023   IRONPCTSAT 10 (L) 12/06/2023   Lab Results  Component Value Date   RBC 3.50 (L) 12/06/2023   No results found for: "KPAFRELGTCHN", "LAMBDASER", "KAPLAMBRATIO" No results found for: "IGGSERUM", "IGA", "IGMSERUM" No results found for: "TOTALPROTELP", "ALBUMINELP", "A1GS", "A2GS", "BETS", "BETA2SER", "GAMS", "MSPIKE", "SPEI"   Chemistry      Component Value Date/Time   NA 139 12/06/2023 0813   K 5.8 (H) 12/06/2023 0813   CL 103 12/06/2023 0813   CO2 22 12/06/2023 0813   BUN 10 12/06/2023 0813   CREATININE 0.73 12/06/2023 0813   GLU 88 12/26/2019 0000      Component Value Date/Time   CALCIUM 9.2 12/06/2023 0813   ALKPHOS 104 12/06/2023 0813   AST 20 12/06/2023 0813   ALT 13 12/06/2023 0813   BILITOT 0.3 12/06/2023 0813       Impression and Plan: Morgan Burgess is a pleasant 42 yo caucasian female with iron deficiency anemia secondary to malabsorption after gastric bypass (2004).  We will get her set up for 3 doses of Iv iron starting this afternoon.  Follow-up with lab in 2 months.   Eileen Stanford, NP 12/20/20248:40 AM

## 2023-12-15 NOTE — Addendum Note (Signed)
Addended by: Corena Pilgrim on: 12/15/2023 10:42 AM   Modules accepted: Orders

## 2023-12-15 NOTE — Patient Instructions (Signed)
Iron Sucrose Injection What is this medication? IRON SUCROSE (EYE ern SOO krose) treats low levels of iron (iron deficiency anemia) in people with kidney disease. Iron is a mineral that plays an important role in making red blood cells, which carry oxygen from your lungs to the rest of your body. This medicine may be used for other purposes; ask your health care provider or pharmacist if you have questions. COMMON BRAND NAME(S): Venofer What should I tell my care team before I take this medication? They need to know if you have any of these conditions: Anemia not caused by low iron levels Heart disease High levels of iron in the blood Kidney disease Liver disease An unusual or allergic reaction to iron, other medications, foods, dyes, or preservatives Pregnant or trying to get pregnant Breastfeeding How should I use this medication? This medication is for infusion into a vein. It is given in a hospital or clinic setting. Talk to your care team about the use of this medication in children. While this medication may be prescribed for children as young as 2 years for selected conditions, precautions do apply. Overdosage: If you think you have taken too much of this medicine contact a poison control center or emergency room at once. NOTE: This medicine is only for you. Do not share this medicine with others. What if I miss a dose? Keep appointments for follow-up doses. It is important not to miss your dose. Call your care team if you are unable to keep an appointment. What may interact with this medication? Do not take this medication with any of the following: Deferoxamine Dimercaprol Other iron products This medication may also interact with the following: Chloramphenicol Deferasirox This list may not describe all possible interactions. Give your health care provider a list of all the medicines, herbs, non-prescription drugs, or dietary supplements you use. Also tell them if you smoke,  drink alcohol, or use illegal drugs. Some items may interact with your medicine. What should I watch for while using this medication? Visit your care team regularly. Tell your care team if your symptoms do not start to get better or if they get worse. You may need blood work done while you are taking this medication. You may need to follow a special diet. Talk to your care team. Foods that contain iron include: whole grains/cereals, dried fruits, beans, or peas, leafy green vegetables, and organ meats (liver, kidney). What side effects may I notice from receiving this medication? Side effects that you should report to your care team as soon as possible: Allergic reactions--skin rash, itching, hives, swelling of the face, lips, tongue, or throat Low blood pressure--dizziness, feeling faint or lightheaded, blurry vision Shortness of breath Side effects that usually do not require medical attention (report to your care team if they continue or are bothersome): Flushing Headache Joint pain Muscle pain Nausea Pain, redness, or irritation at injection site This list may not describe all possible side effects. Call your doctor for medical advice about side effects. You may report side effects to FDA at 1-800-FDA-1088. Where should I keep my medication? This medication is given in a hospital or clinic. It will not be stored at home. NOTE: This sheet is a summary. It may not cover all possible information. If you have questions about this medicine, talk to your doctor, pharmacist, or health care provider.  2024 Elsevier/Gold Standard (2023-05-19 00:00:00)

## 2023-12-21 DIAGNOSIS — M2578 Osteophyte, vertebrae: Secondary | ICD-10-CM | POA: Diagnosis not present

## 2023-12-22 ENCOUNTER — Inpatient Hospital Stay: Payer: BC Managed Care – PPO

## 2023-12-22 VITALS — BP 127/69 | HR 73 | Temp 97.7°F | Resp 18

## 2023-12-22 DIAGNOSIS — K912 Postsurgical malabsorption, not elsewhere classified: Secondary | ICD-10-CM | POA: Diagnosis not present

## 2023-12-22 DIAGNOSIS — Z9884 Bariatric surgery status: Secondary | ICD-10-CM

## 2023-12-22 DIAGNOSIS — K909 Intestinal malabsorption, unspecified: Secondary | ICD-10-CM

## 2023-12-22 DIAGNOSIS — D509 Iron deficiency anemia, unspecified: Secondary | ICD-10-CM | POA: Diagnosis not present

## 2023-12-22 DIAGNOSIS — D508 Other iron deficiency anemias: Secondary | ICD-10-CM

## 2023-12-22 MED ORDER — DIPHENHYDRAMINE HCL 25 MG PO CAPS: 25.0000 mg | ORAL_CAPSULE | Freq: Once | ORAL | Status: DC

## 2023-12-22 MED ORDER — ACETAMINOPHEN 325 MG PO TABS: 650.0000 mg | ORAL_TABLET | Freq: Once | ORAL | Status: DC

## 2023-12-22 MED ORDER — SODIUM CHLORIDE 0.9 % IV SOLN
300.0000 mg | Freq: Once | INTRAVENOUS | Status: AC
  Administered 2023-12-22: 300 mg via INTRAVENOUS
  Filled 2023-12-22: qty 300

## 2023-12-22 MED ORDER — SODIUM CHLORIDE 0.9 % IV SOLN: Freq: Once | INTRAVENOUS | Status: AC

## 2023-12-22 NOTE — Progress Notes (Signed)
Pt took Tylenol 650 mg and Benadryl 25 mg PO at 0920 this AM.

## 2023-12-25 DIAGNOSIS — Z9884 Bariatric surgery status: Secondary | ICD-10-CM | POA: Diagnosis not present

## 2023-12-26 ENCOUNTER — Inpatient Hospital Stay: Payer: BC Managed Care – PPO

## 2023-12-26 VITALS — BP 118/63 | HR 58 | Temp 98.5°F | Resp 16

## 2023-12-26 DIAGNOSIS — K912 Postsurgical malabsorption, not elsewhere classified: Secondary | ICD-10-CM | POA: Diagnosis not present

## 2023-12-26 DIAGNOSIS — Z9884 Bariatric surgery status: Secondary | ICD-10-CM

## 2023-12-26 DIAGNOSIS — D508 Other iron deficiency anemias: Secondary | ICD-10-CM

## 2023-12-26 DIAGNOSIS — K909 Intestinal malabsorption, unspecified: Secondary | ICD-10-CM

## 2023-12-26 DIAGNOSIS — D509 Iron deficiency anemia, unspecified: Secondary | ICD-10-CM | POA: Diagnosis not present

## 2023-12-26 MED ORDER — SODIUM CHLORIDE 0.9 % IV SOLN: Freq: Once | INTRAVENOUS | Status: AC

## 2023-12-26 MED ORDER — SODIUM CHLORIDE 0.9 % IV SOLN
300.0000 mg | Freq: Once | INTRAVENOUS | Status: AC
  Administered 2023-12-26: 300 mg via INTRAVENOUS
  Filled 2023-12-26: qty 300

## 2023-12-26 MED ORDER — ACETAMINOPHEN 325 MG PO TABS: 650.0000 mg | ORAL_TABLET | Freq: Once | ORAL | Status: DC

## 2023-12-26 MED ORDER — DIPHENHYDRAMINE HCL 25 MG PO CAPS: 25.0000 mg | ORAL_CAPSULE | Freq: Once | ORAL | Status: DC

## 2023-12-26 NOTE — Patient Instructions (Signed)
 Iron Sucrose Injection What is this medication? IRON SUCROSE (EYE ern SOO krose) treats low levels of iron (iron deficiency anemia) in people with kidney disease. Iron is a mineral that plays an important role in making red blood cells, which carry oxygen from your lungs to the rest of your body. This medicine may be used for other purposes; ask your health care provider or pharmacist if you have questions. COMMON BRAND NAME(S): Venofer What should I tell my care team before I take this medication? They need to know if you have any of these conditions: Anemia not caused by low iron levels Heart disease High levels of iron in the blood Kidney disease Liver disease An unusual or allergic reaction to iron, other medications, foods, dyes, or preservatives Pregnant or trying to get pregnant Breastfeeding How should I use this medication? This medication is for infusion into a vein. It is given in a hospital or clinic setting. Talk to your care team about the use of this medication in children. While this medication may be prescribed for children as young as 2 years for selected conditions, precautions do apply. Overdosage: If you think you have taken too much of this medicine contact a poison control center or emergency room at once. NOTE: This medicine is only for you. Do not share this medicine with others. What if I miss a dose? Keep appointments for follow-up doses. It is important not to miss your dose. Call your care team if you are unable to keep an appointment. What may interact with this medication? Do not take this medication with any of the following: Deferoxamine Dimercaprol Other iron products This medication may also interact with the following: Chloramphenicol Deferasirox This list may not describe all possible interactions. Give your health care provider a list of all the medicines, herbs, non-prescription drugs, or dietary supplements you use. Also tell them if you smoke,  drink alcohol, or use illegal drugs. Some items may interact with your medicine. What should I watch for while using this medication? Visit your care team regularly. Tell your care team if your symptoms do not start to get better or if they get worse. You may need blood work done while you are taking this medication. You may need to follow a special diet. Talk to your care team. Foods that contain iron include: whole grains/cereals, dried fruits, beans, or peas, leafy green vegetables, and organ meats (liver, kidney). What side effects may I notice from receiving this medication? Side effects that you should report to your care team as soon as possible: Allergic reactions--skin rash, itching, hives, swelling of the face, lips, tongue, or throat Low blood pressure--dizziness, feeling faint or lightheaded, blurry vision Shortness of breath Side effects that usually do not require medical attention (report to your care team if they continue or are bothersome): Flushing Headache Joint pain Muscle pain Nausea Pain, redness, or irritation at injection site This list may not describe all possible side effects. Call your doctor for medical advice about side effects. You may report side effects to FDA at 1-800-FDA-1088. Where should I keep my medication? This medication is given in a hospital or clinic. It will not be stored at home. NOTE: This sheet is a summary. It may not cover all possible information. If you have questions about this medicine, talk to your doctor, pharmacist, or health care provider.  2024 Elsevier/Gold Standard (2023-05-19 00:00:00)

## 2023-12-26 NOTE — Progress Notes (Signed)
Patient does not want to stay for the recommended 30 minute post IV iron observation. Patient discharged ambulatory without complaints or concerns.

## 2024-01-25 DIAGNOSIS — R0602 Shortness of breath: Secondary | ICD-10-CM | POA: Diagnosis not present

## 2024-01-25 DIAGNOSIS — R059 Cough, unspecified: Secondary | ICD-10-CM | POA: Diagnosis not present

## 2024-01-25 DIAGNOSIS — J40 Bronchitis, not specified as acute or chronic: Secondary | ICD-10-CM | POA: Diagnosis not present

## 2024-01-25 DIAGNOSIS — R053 Chronic cough: Secondary | ICD-10-CM | POA: Diagnosis not present

## 2024-02-01 ENCOUNTER — Other Ambulatory Visit: Payer: Self-pay | Admitting: Family Medicine

## 2024-02-01 DIAGNOSIS — G4700 Insomnia, unspecified: Secondary | ICD-10-CM

## 2024-02-16 ENCOUNTER — Inpatient Hospital Stay: Payer: BC Managed Care – PPO | Admitting: Family

## 2024-02-26 ENCOUNTER — Other Ambulatory Visit: Payer: Self-pay | Admitting: Family Medicine

## 2024-03-01 ENCOUNTER — Inpatient Hospital Stay: Payer: BC Managed Care – PPO | Admitting: Family

## 2024-03-05 ENCOUNTER — Telehealth: Payer: Self-pay

## 2024-03-05 NOTE — Telephone Encounter (Signed)
 Copied from CRM [id number]. Topic: Clinical - Medication Question >> Mar 05, 2024  8:41 AM Adaysia C wrote: Reason for CRM: Julie([facility]) called in to ask the provider if it will be safe for the patient to take the prescribed EA:[id number] 800-26.6 MG TABS because the patient has an Nsaids allergy; The pharmacy needs an answer before they can fill the RX and send it to the patient; Please follow up with [facility] [phone] Reference#:[id number]

## 2024-03-05 NOTE — Telephone Encounter (Signed)
 She is not truly allergic.  She had gastric bypass and was to avoid NSAIDs whenever possible.  She has taken this before w/o difficulty

## 2024-03-05 NOTE — Telephone Encounter (Addendum)
 Please accept or decline risk for medication allergy and CMA can return call to [facility]

## 2024-03-05 NOTE — Telephone Encounter (Signed)
 Called and provided this statement to Pharmacist from [facility] and she documented and stated this would be filled

## 2024-03-14 DIAGNOSIS — F419 Anxiety disorder, unspecified: Secondary | ICD-10-CM | POA: Insufficient documentation

## 2024-03-14 DIAGNOSIS — D649 Anemia, unspecified: Secondary | ICD-10-CM | POA: Insufficient documentation

## 2024-03-15 ENCOUNTER — Encounter: Payer: Self-pay | Admitting: Family

## 2024-03-22 ENCOUNTER — Inpatient Hospital Stay: Payer: Self-pay | Admitting: Family

## 2024-03-24 ENCOUNTER — Other Ambulatory Visit: Payer: Self-pay | Admitting: Family Medicine

## 2024-04-10 DIAGNOSIS — N882 Stricture and stenosis of cervix uteri: Secondary | ICD-10-CM | POA: Diagnosis not present

## 2024-04-10 DIAGNOSIS — Z1331 Encounter for screening for depression: Secondary | ICD-10-CM | POA: Diagnosis not present

## 2024-04-10 DIAGNOSIS — Z3009 Encounter for other general counseling and advice on contraception: Secondary | ICD-10-CM | POA: Diagnosis not present

## 2024-04-10 DIAGNOSIS — Z975 Presence of (intrauterine) contraceptive device: Secondary | ICD-10-CM | POA: Diagnosis not present

## 2024-04-10 DIAGNOSIS — R102 Pelvic and perineal pain: Secondary | ICD-10-CM | POA: Diagnosis not present

## 2024-04-16 ENCOUNTER — Encounter: Payer: Self-pay | Admitting: Family Medicine

## 2024-04-17 ENCOUNTER — Encounter: Payer: Self-pay | Admitting: Family Medicine

## 2024-04-17 ENCOUNTER — Telehealth (INDEPENDENT_AMBULATORY_CARE_PROVIDER_SITE_OTHER): Payer: Self-pay | Admitting: Family Medicine

## 2024-04-17 VITALS — BP 118/74 | HR 68 | Wt 210.0 lb

## 2024-04-17 DIAGNOSIS — G4700 Insomnia, unspecified: Secondary | ICD-10-CM

## 2024-04-17 DIAGNOSIS — R220 Localized swelling, mass and lump, head: Secondary | ICD-10-CM

## 2024-04-17 DIAGNOSIS — L551 Sunburn of second degree: Secondary | ICD-10-CM

## 2024-04-17 MED ORDER — PREDNISONE 20 MG PO TABS: 40.0000 mg | ORAL_TABLET | Freq: Every day | ORAL | 0 refills | Status: AC

## 2024-04-17 MED ORDER — FLUOXETINE HCL 40 MG PO CAPS
40.0000 mg | ORAL_CAPSULE | Freq: Every day | ORAL | 3 refills | Status: AC
Start: 2024-04-17 — End: ?

## 2024-04-17 MED ORDER — ZOLPIDEM TARTRATE 10 MG PO TABS: 10.0000 mg | ORAL_TABLET | Freq: Every day | ORAL | 0 refills | Status: DC

## 2024-04-17 NOTE — Progress Notes (Signed)
 Virtual Visit via Video   I connected with patient on 04/17/24 at  9:40 AM EDT by a video enabled telemedicine application and verified that I am speaking with the correct person using two identifiers.  Location patient: Home Location provider: [facility], Office Persons participating in the virtual visit: Patient, Provider, CMA Lynnie Saucier H)  I discussed the limitations of evaluation and management by telemedicine and the availability of in person appointments. The patient expressed understanding and agreed to proceed.  Subjective:   HPI:   Face swelling- pt got significant sunburn last week.  Face was weeping Friday, Saturday, Sunday.  Subsequently developed facial swelling.  Yesterday get a Kenalog injection.  Both eyes are still swollen- face is itching and burning.  Using cool wash clothes, taking benadryl .  Was told she is not able to take NSAIDs after abd surgery in the fall.    ROS:   See pertinent positives and negatives per HPI.  Patient Active Problem List   Diagnosis Date Noted   Overweight (BMI 25.0-29.9) 10/16/2023   Insomnia 12/29/2021   Long-term current use of stimulant 12/26/2019   Physical exam 12/19/2019   Binge eating disorder 10/04/2017   Vitamin D  deficiency 10/28/2016   IDA (iron  deficiency anemia) 09/28/2016   Iron  malabsorption 09/28/2016   Attention deficit hyperactivity disorder (ADHD) 09/16/2016   Thinning hair 11/28/2014   Generalized anxiety disorder 11/01/2013   S/P gastric bypass 11/01/2013   Metatarsalgia 05/16/2011    Social History   Tobacco Use   Smoking status: Never   Smokeless tobacco: Never  Substance Use Topics   Alcohol use: No    Current Outpatient Medications:    ALPRAZolam  (XANAX ) 0.5 MG tablet, Take 1 tablet (0.5 mg total) by mouth 2 (two) times daily as needed for anxiety., Disp: 30 tablet, Rfl: 1   Cholecalciferol (VITAMIN D3) 50 MCG (2000 UT) capsule, Take 1 capsule (2,000 Units total) by mouth daily., Disp: 90  capsule, Rfl: 1   Continuous Glucose Sensor (FREESTYLE LIBRE 3 PLUS SENSOR) MISC, Change sensor every 15 days, Disp: 6 each, Rfl: 1   FLUoxetine  (PROZAC ) 40 MG capsule, Take 1 capsule (40 mg total) by mouth daily., Disp: 90 capsule, Rfl: 3   Glucagon  (GVOKE HYPOPEN  2-PACK) 1 MG/0.2ML SOAJ, Inject once into the skin as needed for severe hypoglycemia, Disp: 0.4 mL, Rfl: 1   Ibuprofen -Famotidine  800-26.6 MG TABS, TAKE 1 TABLET THREE TIMES A DAY, Disp: 270 tablet, Rfl: 3   levocetirizine (XYZAL ) 5 MG tablet, TAKE 1 TABLET EVERY EVENING, Disp: 90 tablet, Rfl: 3   levonorgestrel  (MIRENA ) 20 MCG/24HR IUD, 1 each by Intrauterine route continuous., Disp: , Rfl:    ondansetron  (ZOFRAN -ODT) 4 MG disintegrating tablet, Dissolve 1 tablet (4 mg total) by mouth every 8 (eight) hours as needed for nausea or vomiting., Disp: 20 tablet, Rfl: 1   zolpidem  (AMBIEN ) 10 MG tablet, TAKE 1 TABLET AT BEDTIME, Disp: 90 tablet, Rfl: 0  Allergies  Allergen Reactions   Latex Hives   Nsaids Other (See Comments)    S/P gastric bypass, ulcer risk    Objective:   BP 118/74   Pulse 68   Wt 210 lb (95.3 kg)   SpO2 99%   BMI 33.89 kg/m  AAOx3, NAD NCAT, EOMI No obvious CN deficits Very sunburned, face is swollen- particularly around eyes Pt is able to speak clearly, coherently without shortness of breath or increased work of breathing.  Thought process is linear.  Mood is appropriate.   Assessment and  Plan:   Blistering sunburn/facial swelling- new.  Pt reports she got the excessive sun exposure while driving to the beach in a jeep.  She got a Kenalog injxn yesterday but face remains itchy, swollen, burning.  She is using cool compresses and taking Benadryl .  Will start short course of Prednisone  to help w/ sxs since she is unable to take NSAIDs.  Reviewed need for sun protection going forward.   Laymon Priest, MD 04/17/2024

## 2024-04-19 ENCOUNTER — Inpatient Hospital Stay: Payer: Self-pay | Admitting: Family

## 2024-04-29 DIAGNOSIS — Z1151 Encounter for screening for human papillomavirus (HPV): Secondary | ICD-10-CM | POA: Diagnosis not present

## 2024-04-29 DIAGNOSIS — Z6834 Body mass index (BMI) 34.0-34.9, adult: Secondary | ICD-10-CM | POA: Diagnosis not present

## 2024-04-29 DIAGNOSIS — R102 Pelvic and perineal pain: Secondary | ICD-10-CM | POA: Diagnosis not present

## 2024-04-29 DIAGNOSIS — Z124 Encounter for screening for malignant neoplasm of cervix: Secondary | ICD-10-CM | POA: Diagnosis not present

## 2024-04-29 DIAGNOSIS — Z01419 Encounter for gynecological examination (general) (routine) without abnormal findings: Secondary | ICD-10-CM | POA: Diagnosis not present

## 2024-05-15 DIAGNOSIS — Z30433 Encounter for removal and reinsertion of intrauterine contraceptive device: Secondary | ICD-10-CM | POA: Diagnosis not present

## 2024-05-15 DIAGNOSIS — Z975 Presence of (intrauterine) contraceptive device: Secondary | ICD-10-CM | POA: Insufficient documentation

## 2024-05-15 DIAGNOSIS — Z309 Encounter for contraceptive management, unspecified: Secondary | ICD-10-CM | POA: Diagnosis not present

## 2024-05-29 DIAGNOSIS — Z9884 Bariatric surgery status: Secondary | ICD-10-CM | POA: Diagnosis not present

## 2024-05-29 DIAGNOSIS — E66812 Obesity, class 2: Secondary | ICD-10-CM | POA: Diagnosis not present

## 2024-06-03 DIAGNOSIS — D485 Neoplasm of uncertain behavior of skin: Secondary | ICD-10-CM | POA: Diagnosis not present

## 2024-06-03 DIAGNOSIS — L578 Other skin changes due to chronic exposure to nonionizing radiation: Secondary | ICD-10-CM | POA: Diagnosis not present

## 2024-06-04 ENCOUNTER — Other Ambulatory Visit: Payer: Self-pay | Admitting: Family Medicine

## 2024-06-11 DIAGNOSIS — C44519 Basal cell carcinoma of skin of other part of trunk: Secondary | ICD-10-CM | POA: Diagnosis not present

## 2024-07-01 ENCOUNTER — Encounter: Payer: Self-pay | Admitting: Family Medicine

## 2024-07-01 DIAGNOSIS — G4700 Insomnia, unspecified: Secondary | ICD-10-CM

## 2024-07-01 MED ORDER — ZOLPIDEM TARTRATE 10 MG PO TABS: 10.0000 mg | ORAL_TABLET | Freq: Every day | ORAL | 0 refills | Status: DC

## 2024-09-05 ENCOUNTER — Telehealth: Payer: Self-pay

## 2024-09-05 NOTE — Telephone Encounter (Signed)
 Samantha from [facility] called to let the provider know that the ibuprofen -famotidine  800-26.6 is no longer covered and they would need to be written separately instead. [facility] is needing provider authorization to do so.

## 2024-09-05 NOTE — Telephone Encounter (Signed)
 Please let pt know that her combination ibuprofen /famotidine  is no longer covered.  She can try and get a prior authorization from her bariatric surgeon or she can take ibuprofen  and famotidine  OTC

## 2024-09-06 ENCOUNTER — Encounter: Payer: Self-pay | Admitting: Family

## 2024-09-06 NOTE — Telephone Encounter (Unsigned)
 Copied from CRM #[id number]. Topic: Clinical - Medication Question >> Sep 06, 2024  9:29 AM Vena HERO wrote: Reason for CRM: Sari Piety [facility] from [facility] to speak to someone about a prescription Ibuprofen -Famotidine  800-26.6 MG TABS. States prescription is no longer covered.  PA is requested with a new prescription or it will be cancelled.

## 2024-09-06 NOTE — Telephone Encounter (Signed)
 Called patient about medication denial. Patient told me she already spoke to someone (I do not see any documentation other than a vm being left). I apologized for the double call.  Patient is no longer able to take NSAIDS. She she will not take OTC ibuprofen  nor the famotidine .

## 2024-09-06 NOTE — Telephone Encounter (Signed)
 Left VM for patient return call regarding latest PCP notes

## 2024-09-12 DIAGNOSIS — L910 Hypertrophic scar: Secondary | ICD-10-CM | POA: Diagnosis not present

## 2024-09-30 ENCOUNTER — Encounter: Payer: Self-pay | Admitting: Family Medicine

## 2024-09-30 ENCOUNTER — Other Ambulatory Visit: Payer: Self-pay

## 2024-09-30 DIAGNOSIS — G4700 Insomnia, unspecified: Secondary | ICD-10-CM

## 2024-09-30 MED ORDER — ZOLPIDEM TARTRATE 10 MG PO TABS: 10.0000 mg | ORAL_TABLET | Freq: Every day | ORAL | 1 refills | Status: DC

## 2024-09-30 NOTE — Addendum Note (Signed)
 Addended by: Lux Skilton E on: 09/30/2024 03:39 PM   Modules accepted: Orders

## 2024-10-09 ENCOUNTER — Encounter: Payer: Self-pay | Admitting: Family Medicine

## 2024-10-09 ENCOUNTER — Ambulatory Visit (INDEPENDENT_AMBULATORY_CARE_PROVIDER_SITE_OTHER): Admitting: Family Medicine

## 2024-10-09 VITALS — BP 128/68 | HR 80 | Temp 98.5°F | Ht 66.0 in | Wt 227.2 lb

## 2024-10-09 DIAGNOSIS — Z0000 Encounter for general adult medical examination without abnormal findings: Secondary | ICD-10-CM

## 2024-10-09 DIAGNOSIS — E559 Vitamin D deficiency, unspecified: Secondary | ICD-10-CM

## 2024-10-09 DIAGNOSIS — F419 Anxiety disorder, unspecified: Secondary | ICD-10-CM

## 2024-10-09 DIAGNOSIS — Z114 Encounter for screening for human immunodeficiency virus [HIV]: Secondary | ICD-10-CM | POA: Diagnosis not present

## 2024-10-09 DIAGNOSIS — E669 Obesity, unspecified: Secondary | ICD-10-CM

## 2024-10-09 DIAGNOSIS — D508 Other iron deficiency anemias: Secondary | ICD-10-CM

## 2024-10-09 DIAGNOSIS — Z1159 Encounter for screening for other viral diseases: Secondary | ICD-10-CM

## 2024-10-09 MED ORDER — ALPRAZOLAM 1 MG PO TABS: 1.0000 mg | ORAL_TABLET | Freq: Two times a day (BID) | ORAL | 0 refills | Status: DC | PRN

## 2024-10-09 NOTE — Telephone Encounter (Signed)
 Patient would like medication sent to [facility] of [facility]. I have pended the medication    Copied from CRM #[id number]. Topic: Clinical - Medication Question >> Oct 09, 2024  3:21 PM Sasha M wrote: Reason for CRM: Matt from [facility] called because they received a script for Alprazolam  1mg  to be filled but he confirmed with pt this should have went to the [facility] on file. Please correct this script and send to [facility]

## 2024-10-09 NOTE — Assessment & Plan Note (Signed)
 Pt's PE WNL w/ exception of BMI.  UTD on pap, mammo.  Declines Tdap, flu.  Check labs.  Anticipatory guidance provided.

## 2024-10-09 NOTE — Patient Instructions (Signed)
 Follow up in 1 year or as needed We'll notify you of your lab results and make any changes if needed INCREASE the Alprazolam  to 1mg  as needed.  If things aren't quite so bad- use 1/2 tab, but if really stressed, take the whole tab Continue to work on healthy diet and regular exercise- you can do it! Call with any questions or concerns Stay Safe!  Stay Healthy! I'm SO sorry for your loss!

## 2024-10-09 NOTE — Progress Notes (Signed)
   Subjective:    Patient ID: Morgan Burgess, female    DOB: July 23, 1981, 43 y.o.   MRN: [id number]  HPI CPE- UTD on pap.  Declines Tdap.  UTD on mammo  Patient Care Team    Relationship Specialty Notifications Start End  Mahlon Comer BRAVO, MD PCP - General Family Medicine  12/29/21   Milas Layton SAILOR, MD Referring Physician Obstetrics  05/12/23   Willma Camelia CROME, RN (Inactive) [facility] Care Management   10/30/23   [facility]    10/09/24 10/09/24  Karin Delon CROME, NP  Nurse Practitioner  10/09/24   Dermatology, [facility]    10/09/24     Health Maintenance  Topic Date Due   HIV Screening  Never done   Hepatitis C Screening  Never done   Hepatitis B Vaccines 19-59 Average Risk (1 of 3 - 19+ 3-dose series) Never done   HPV VACCINES (1 - 3-dose SCDM series) Never done   DTaP/Tdap/Td (2 - Td or Tdap) 11/29/2019   Cervical Cancer Screening (HPV/Pap Cotest)  03/22/2020   COVID-19 Vaccine (3 - Pfizer risk series) 01/14/2021   Influenza Vaccine  07/26/2024   Mammogram  12/04/2025   Pneumococcal Vaccine  Aged Out   Meningococcal B Vaccine  Aged Out      Review of Systems Patient reports no vision/ hearing changes, adenopathy,fever, persistant/recurrent hoarseness , swallowing issues, chest pain, palpitations, edema, persistant/recurrent cough, hemoptysis, dyspnea (rest/exertional/paroxysmal nocturnal), gastrointestinal bleeding (melena, rectal bleeding), abdominal pain, significant heartburn, bowel changes, GU symptoms (dysuria, hematuria, incontinence), Gyn symptoms (abnormal  bleeding, pain),  syncope, focal weakness, memory loss, numbness & tingling, skin/hair/nail changes, abnormal bruising or bleeding.   + 17 lb gain since April + anxiety- dad passed 2 weeks ago unexpectedly    Objective:   Physical Exam General Appearance:    Alert, cooperative, no distress, appears stated age, obese  Head:    Normocephalic, without obvious abnormality, atraumatic  Eyes:    PERRL,  conjunctiva/corneas clear, EOM's intact both eyes  Ears:    Normal TM's and external ear canals, both ears  Nose:   Nares normal, septum midline, mucosa normal, no drainage    or sinus tenderness  Throat:   Lips, mucosa, and tongue normal; teeth and gums normal  Neck:   Supple, symmetrical, trachea midline, no adenopathy;    Thyroid : no enlargement/tenderness/nodules  Back:     Symmetric, no curvature, ROM normal, no CVA tenderness  Lungs:     Clear to auscultation bilaterally, respirations unlabored  Chest Wall:    No tenderness or deformity   Heart:    Regular rate and rhythm, S1 and S2 normal, no murmur, rub   or gallop  Breast Exam:    Deferred to GYN  Abdomen:     Soft, non-tender, bowel sounds active all four quadrants,    no masses, no organomegaly  Genitalia:    Deferred to GYN  Rectal:    Extremities:   Extremities normal, atraumatic, no cyanosis or edema  Pulses:   2+ and symmetric all extremities  Skin:   Skin color, texture, turgor normal, no rashes or lesions  Lymph nodes:   Cervical, supraclavicular, and axillary nodes normal  Neurologic:   CNII-XII intact, normal strength, sensation and reflexes    throughout          Assessment & Plan:

## 2024-10-09 NOTE — Assessment & Plan Note (Signed)
 Deteriorated w/ father's recent passing.  Will increase Alprazolam  to 1mg  prn.  Continue Fluoxetine .  Encouraged grief counseling.  Will follow.

## 2024-10-10 ENCOUNTER — Other Ambulatory Visit: Payer: Self-pay

## 2024-10-10 ENCOUNTER — Encounter: Payer: Self-pay | Admitting: Family Medicine

## 2024-10-10 LAB — VITAMIN D 25 HYDROXY (VIT D DEFICIENCY, FRACTURES): VITD: 28.26 ng/mL — ABNORMAL LOW (ref 30.00–100.00)

## 2024-10-10 LAB — BASIC METABOLIC PANEL WITH GFR
BUN: 13 mg/dL (ref 6–23)
CO2: 27 meq/L (ref 19–32)
Calcium: 9 mg/dL (ref 8.4–10.5)
Chloride: 103 meq/L (ref 96–112)
Creatinine, Ser: 0.77 mg/dL (ref 0.40–1.20)
GFR: 93.69 mL/min (ref >60.00)
Glucose, Bld: 89 mg/dL (ref 70–99)
Potassium: 4.6 meq/L (ref 3.5–5.1)
Sodium: 139 meq/L (ref 135–145)

## 2024-10-10 LAB — IBC + FERRITIN
Ferritin: 11.8 ng/mL (ref 10.0–291.0)
Iron: 84 ug/dL (ref 42–145)
Saturation Ratios: 24.3 % (ref 20.0–50.0)
TIBC: 345.8 ug/dL (ref 250.0–450.0)
Transferrin: 247 mg/dL (ref 212.0–360.0)

## 2024-10-10 LAB — LIPID PANEL
Cholesterol: 156 mg/dL (ref 0–200)
HDL: 65.2 mg/dL (ref >39.00)
LDL Cholesterol: 78 mg/dL (ref 0–99)
NonHDL: 90.55
Total CHOL/HDL Ratio: 2
Triglycerides: 61 mg/dL (ref 0.0–149.0)
VLDL: 12.2 mg/dL (ref 0.0–40.0)

## 2024-10-10 LAB — CBC WITH DIFFERENTIAL/PLATELET
Basophils Absolute: 0 K/uL (ref 0.0–0.1)
Basophils Relative: 0.9 % (ref 0.0–3.0)
Eosinophils Absolute: 0.1 K/uL (ref 0.0–0.7)
Eosinophils Relative: 2.7 % (ref 0.0–5.0)
HCT: 36.6 % (ref 36.0–46.0)
Hemoglobin: 12.5 g/dL (ref 12.0–15.0)
Lymphocytes Relative: 28.5 % (ref 12.0–46.0)
Lymphs Abs: 1.6 K/uL (ref 0.7–4.0)
MCHC: 34 g/dL (ref 30.0–36.0)
MCV: 92.4 fl (ref 78.0–100.0)
Monocytes Absolute: 0.5 K/uL (ref 0.1–1.0)
Monocytes Relative: 9.4 % (ref 3.0–12.0)
Neutro Abs: 3.2 K/uL (ref 1.4–7.7)
Neutrophils Relative %: 58.5 % (ref 43.0–77.0)
Platelets: 163 K/uL (ref 150.0–400.0)
RBC: 3.96 Mil/uL (ref 3.87–5.11)
RDW: 12.8 % (ref 11.5–15.5)
WBC: 5.4 K/uL (ref 4.0–10.5)

## 2024-10-10 LAB — MAGNESIUM: Magnesium: 1.8 mg/dL (ref 1.5–2.5)

## 2024-10-10 LAB — HIV ANTIBODY (ROUTINE TESTING W REFLEX)
HIV 1&2 Ab, 4th Generation: NONREACTIVE
HIV FINAL INTERPRETATION: NEGATIVE

## 2024-10-10 LAB — TSH: TSH: 1.67 u[IU]/mL (ref 0.35–5.50)

## 2024-10-10 LAB — HEPATIC FUNCTION PANEL
ALT: 8 U/L (ref 0–35)
AST: 12 U/L (ref 0–37)
Albumin: 4.3 g/dL (ref 3.5–5.2)
Alkaline Phosphatase: 81 U/L (ref 39–117)
Bilirubin, Direct: 0.2 mg/dL (ref 0.0–0.3)
Total Bilirubin: 0.6 mg/dL (ref 0.2–1.2)
Total Protein: 6.7 g/dL (ref 6.0–8.3)

## 2024-10-10 LAB — HEMOGLOBIN A1C: Hgb A1c MFr Bld: 5 % (ref 4.6–6.5)

## 2024-10-10 LAB — HEPATITIS C ANTIBODY: Hepatitis C Ab: NONREACTIVE

## 2024-10-10 NOTE — Telephone Encounter (Unsigned)
 Copied from CRM #[id number]. Topic: Clinical - Prescription Issue >> Oct 10, 2024  2:35 PM Nessti S wrote: Reason for CRM: patient called because script was sent to wrong pharmacy. She will like  ALPRAZolam  (XANAX ) 1 MG tablet sent to [facility]. Call back number [phone]

## 2024-10-10 NOTE — Telephone Encounter (Signed)
 Copied from CRM [id number]. Topic: General - Other >> Oct 10, 2024  3:53 PM Rosina BIRCH wrote: Reason for CRM: patient called wanting to know if Glenys was still available because she want to speak to her again. Patient called stating she got everything taken care of and she was going to tell her what happen [phone]  [phone]

## 2024-10-11 ENCOUNTER — Ambulatory Visit: Admitting: Family Medicine

## 2024-10-11 ENCOUNTER — Other Ambulatory Visit: Payer: Self-pay

## 2024-10-11 ENCOUNTER — Ambulatory Visit: Payer: Self-pay | Admitting: Family Medicine

## 2024-10-11 MED ORDER — VITAMIN D (ERGOCALCIFEROL) 1.25 MG (50000 UNIT) PO CAPS: 50000.0000 [IU] | ORAL_CAPSULE | ORAL | 0 refills | Status: AC

## 2024-10-11 NOTE — Telephone Encounter (Signed)
 Ordered and sent Vitamin D  [facility]. Patient is asking what else can she do since she has been taking vitamin d  for a year now

## 2024-10-30 ENCOUNTER — Encounter: Payer: Self-pay | Admitting: Family Medicine

## 2024-10-30 MED ORDER — FREESTYLE LIBRE 3 PLUS SENSOR MISC: 1 refills | Status: AC

## 2024-11-07 ENCOUNTER — Other Ambulatory Visit (HOSPITAL_COMMUNITY): Payer: Self-pay

## 2024-11-07 ENCOUNTER — Telehealth: Payer: Self-pay

## 2024-11-07 NOTE — Telephone Encounter (Signed)
 Pharmacy Patient Advocate Encounter   Received notification from Onbase that prior authorization for [employer] is required/requested.   Insurance verification completed.   The patient is insured through [employer].   Per test claim: CGM's are not covered unless patient is diabetic and on insulin . After review of her chart, I do not see either of those things.

## 2024-11-20 DIAGNOSIS — Z9884 Bariatric surgery status: Secondary | ICD-10-CM | POA: Diagnosis not present

## 2024-11-20 DIAGNOSIS — Z713 Dietary counseling and surveillance: Secondary | ICD-10-CM | POA: Diagnosis not present

## 2024-11-20 DIAGNOSIS — Z79899 Other long term (current) drug therapy: Secondary | ICD-10-CM | POA: Diagnosis not present

## 2024-12-10 DIAGNOSIS — F411 Generalized anxiety disorder: Secondary | ICD-10-CM | POA: Diagnosis not present

## 2024-12-17 DIAGNOSIS — F4323 Adjustment disorder with mixed anxiety and depressed mood: Secondary | ICD-10-CM | POA: Diagnosis not present

## 2024-12-18 ENCOUNTER — Encounter: Admitting: Family Medicine

## 2024-12-20 ENCOUNTER — Encounter: Payer: Self-pay | Admitting: Family Medicine

## 2024-12-24 DIAGNOSIS — F4323 Adjustment disorder with mixed anxiety and depressed mood: Secondary | ICD-10-CM | POA: Diagnosis not present

## 2024-12-27 ENCOUNTER — Encounter: Admitting: Family Medicine

## 2025-01-07 ENCOUNTER — Encounter: Payer: Self-pay | Admitting: Family Medicine

## 2025-01-07 DIAGNOSIS — G4700 Insomnia, unspecified: Secondary | ICD-10-CM

## 2025-01-08 MED ORDER — SCOPOLAMINE 1 MG/3DAYS TD PT72: 1.0000 | MEDICATED_PATCH | TRANSDERMAL | 0 refills | Status: AC

## 2025-01-08 MED ORDER — VITAMIN D3 50 MCG (2000 UT) PO CAPS: 2000.0000 [IU] | ORAL_CAPSULE | Freq: Every day | ORAL | 3 refills | Status: AC

## 2025-01-08 MED ORDER — ZOLPIDEM TARTRATE 10 MG PO TABS: 10.0000 mg | ORAL_TABLET | Freq: Every day | ORAL | 1 refills | Status: AC

## 2025-01-08 MED ORDER — ALPRAZOLAM 1 MG PO TABS: 1.0000 mg | ORAL_TABLET | Freq: Two times a day (BID) | ORAL | 0 refills | Status: AC | PRN

## 2025-10-10 ENCOUNTER — Encounter: Admitting: Family Medicine
